# Patient Record
Sex: Female | Born: 1970 | Race: White | Hispanic: No | Smoking: Never smoker
Health system: Southern US, Community
[De-identification: ages and names within clinical notes are randomized; demographics above are authoritative.]

## PROBLEM LIST (undated history)

## (undated) DIAGNOSIS — I82409 Acute embolism and thrombosis of unspecified deep veins of unspecified lower extremity: Secondary | ICD-10-CM

## (undated) DIAGNOSIS — Z789 Other specified health status: Secondary | ICD-10-CM

## (undated) DIAGNOSIS — G43909 Migraine, unspecified, not intractable, without status migrainosus: Secondary | ICD-10-CM

## (undated) HISTORY — DX: Other specified health status: Z78.9

## (undated) HISTORY — PX: CHOLECYSTECTOMY: SHX55

## (undated) HISTORY — DX: Acute embolism and thrombosis of unspecified deep veins of unspecified lower extremity: I82.409

## (undated) HISTORY — PX: APPENDECTOMY: SHX54

## (undated) HISTORY — DX: Migraine, unspecified, not intractable, without status migrainosus: G43.909

---

## 2007-05-31 ENCOUNTER — Ambulatory Visit: Payer: Self-pay | Admitting: Family Medicine

## 2009-02-05 ENCOUNTER — Ambulatory Visit: Payer: Self-pay | Admitting: Family Medicine

## 2009-06-14 ENCOUNTER — Ambulatory Visit: Payer: Self-pay | Admitting: Otolaryngology

## 2009-08-27 ENCOUNTER — Encounter: Admission: RE | Admit: 2009-08-27 | Discharge: 2009-09-04 | Payer: Self-pay | Admitting: Specialist

## 2010-02-25 ENCOUNTER — Ambulatory Visit: Payer: Self-pay | Admitting: Family Medicine

## 2010-09-07 LAB — HM MAMMOGRAPHY: HM MAMMO: NORMAL

## 2010-09-09 ENCOUNTER — Ambulatory Visit: Payer: Self-pay | Admitting: Family

## 2011-09-08 LAB — HM PAP SMEAR: HM Pap smear: NORMAL

## 2015-04-08 ENCOUNTER — Encounter: Payer: Self-pay | Admitting: Family Medicine

## 2015-04-08 ENCOUNTER — Ambulatory Visit (INDEPENDENT_AMBULATORY_CARE_PROVIDER_SITE_OTHER): Payer: 59 | Admitting: Family Medicine

## 2015-04-08 VITALS — BP 120/83 | HR 90 | Temp 98.3°F | Resp 16 | Ht 65.0 in | Wt 254.6 lb

## 2015-04-08 DIAGNOSIS — F331 Major depressive disorder, recurrent, moderate: Secondary | ICD-10-CM | POA: Insufficient documentation

## 2015-04-08 DIAGNOSIS — F418 Other specified anxiety disorders: Secondary | ICD-10-CM

## 2015-04-08 DIAGNOSIS — F329 Major depressive disorder, single episode, unspecified: Secondary | ICD-10-CM

## 2015-04-08 DIAGNOSIS — G43909 Migraine, unspecified, not intractable, without status migrainosus: Secondary | ICD-10-CM | POA: Insufficient documentation

## 2015-04-08 DIAGNOSIS — F419 Anxiety disorder, unspecified: Principal | ICD-10-CM

## 2015-04-08 DIAGNOSIS — F330 Major depressive disorder, recurrent, mild: Secondary | ICD-10-CM | POA: Insufficient documentation

## 2015-04-08 DIAGNOSIS — G5600 Carpal tunnel syndrome, unspecified upper limb: Secondary | ICD-10-CM | POA: Insufficient documentation

## 2015-04-08 MED ORDER — ESCITALOPRAM OXALATE 10 MG PO TABS: 10.0000 mg | ORAL_TABLET | Freq: Every day | ORAL | Status: DC

## 2015-04-08 MED ORDER — VENLAFAXINE HCL ER 37.5 MG PO CP24: 75.0000 mg | ORAL_CAPSULE | Freq: Every day | ORAL | Status: DC

## 2015-04-08 NOTE — Patient Instructions (Addendum)
Stop Effexor and let's try Lexapro again for anxiety.  TO Stop effexor: Take 2 37.5mg  capsules in the AM for 3-4 days and then reduce to 1 capsule in the AM for 3-4 days and stop medication.   To Start lexapro: After effexor- Break the tablet in 1/2 for 4 days- then resume full dose.     Escitalopram tablets What is this medicine? ESCITALOPRAM (es sye TAL oh pram) is used to treat depression and certain types of anxiety. This medicine may be used for other purposes; ask your health care provider or pharmacist if you have questions. COMMON BRAND NAME(S): Lexapro What should I tell my health care provider before I take this medicine? They need to know if you have any of these conditions: -bipolar disorder or a family history of bipolar disorder -diabetes -glaucoma -heart disease -kidney or liver disease -receiving electroconvulsive therapy -seizures (convulsions) -suicidal thoughts, plans, or attempt by you or a family member -an unusual or allergic reaction to escitalopram, the related drug citalopram, other medicines, foods, dyes, or preservatives -pregnant or trying to become pregnant -breast-feeding How should I use this medicine? Take this medicine by mouth with a glass of water. Follow the directions on the prescription label. You can take it with or without food. If it upsets your stomach, take it with food. Take your medicine at regular intervals. Do not take it more often than directed. Do not stop taking this medicine suddenly except upon the advice of your doctor. Stopping this medicine too quickly may cause serious side effects or your condition may worsen. A special MedGuide will be given to you by the pharmacist with each prescription and refill. Be sure to read this information carefully each time. Talk to your pediatrician regarding the use of this medicine in children. Special care may be needed. Overdosage: If you think you have taken too much of this medicine contact a  poison control center or emergency room at once. NOTE: This medicine is only for you. Do not share this medicine with others. What if I miss a dose? If you miss a dose, take it as soon as you can. If it is almost time for your next dose, take only that dose. Do not take double or extra doses. What may interact with this medicine? Do not take this medicine with any of the following medications: -certain medicines for fungal infections like fluconazole, itraconazole, ketoconazole, posaconazole, voriconazole -cisapride -citalopram -dofetilide -dronedarone -linezolid -MAOIs like Carbex, Eldepryl, Marplan, Nardil, and Parnate -methylene blue (injected into a vein) -pimozide -thioridazine -ziprasidone This medicine may also interact with the following medications: -alcohol -aspirin and aspirin-like medicines -carbamazepine -certain medicines for depression, anxiety, or psychotic disturbances -certain medicines for migraine headache like almotriptan, eletriptan, frovatriptan, naratriptan, rizatriptan, sumatriptan, zolmitriptan -certain medicines for sleep -certain medicines that treat or prevent blood clots like warfarin, enoxaparin, dalteparin -cimetidine -diuretics -fentanyl -furazolidone -isoniazid -lithium -metoprolol -NSAIDs, medicines for pain and inflammation, like ibuprofen or naproxen -other medicines that prolong the QT interval (cause an abnormal heart rhythm) -procarbazine -rasagiline -supplements like St. John's wort, kava kava, valerian -tramadol -tryptophan This list may not describe all possible interactions. Give your health care provider a list of all the medicines, herbs, non-prescription drugs, or dietary supplements you use. Also tell them if you smoke, drink alcohol, or use illegal drugs. Some items may interact with your medicine. What should I watch for while using this medicine? Tell your doctor if your symptoms do not get better or if they get worse.  Visit  your doctor or health care professional for regular checks on your progress. Because it may take several weeks to see the full effects of this medicine, it is important to continue your treatment as prescribed by your doctor. Patients and their families should watch out for new or worsening thoughts of suicide or depression. Also watch out for sudden changes in feelings such as feeling anxious, agitated, panicky, irritable, hostile, aggressive, impulsive, severely restless, overly excited and hyperactive, or not being able to sleep. If this happens, especially at the beginning of treatment or after a change in dose, call your health care professional. Bonita Quin may get drowsy or dizzy. Do not drive, use machinery, or do anything that needs mental alertness until you know how this medicine affects you. Do not stand or sit up quickly, especially if you are an older patient. This reduces the risk of dizzy or fainting spells. Alcohol may interfere with the effect of this medicine. Avoid alcoholic drinks. Your mouth may get dry. Chewing sugarless gum or sucking hard candy, and drinking plenty of water may help. Contact your doctor if the problem does not go away or is severe. What side effects may I notice from receiving this medicine? Side effects that you should report to your doctor or health care professional as soon as possible: -allergic reactions like skin rash, itching or hives, swelling of the face, lips, or tongue -confusion -feeling faint or lightheaded, falls -fast talking and excited feelings or actions that are out of control -hallucination, loss of contact with reality -seizures -suicidal thoughts or other mood changes -unusual bleeding or bruising Side effects that usually do not require medical attention (report to your doctor or health care professional if they continue or are bothersome): -blurred vision -changes in appetite -change in sex drive or performance -headache -increased  sweating -nausea This list may not describe all possible side effects. Call your doctor for medical advice about side effects. You may report side effects to FDA at 1-800-FDA-1088. Where should I keep my medicine? Keep out of reach of children. Store at room temperature between 15 and 30 degrees C (59 and 86 degrees F). Throw away any unused medicine after the expiration date. NOTE: This sheet is a summary. It may not cover all possible information. If you have questions about this medicine, talk to your doctor, pharmacist, or health care provider.  2015, Elsevier/Gold Standard. (2013-03-21 12:32:55)

## 2015-04-08 NOTE — Assessment & Plan Note (Signed)
Pt would like to try Lexapro again for anxiety. Taper off of venlafaxine and restart lexapro.  Recheck 3 weeks to determine efficacy.

## 2015-04-08 NOTE — Progress Notes (Addendum)
Subjective:    Patient ID: Marcia Simmons, female    DOB: 03/27/1971, 44 y.o.   MRN: [id number]  HPI: Marcia Simmons is a 44 y.o. female presenting on 04/08/2015 for Follow-up   HPI  Pt presents for follow-up of depression. She is currently taking venlafaxine daily for her depression and she doesn't feel it is helping. Previous medications were Zoloft, Lexapro, and Celexa. She is unsure why she switched from lexapro in the past- cannot remember if she had any side effects.  She feels she has more anxiety than depression. Would like to talk about other medications that might help her symptoms.   Past Medical History  Diagnosis Date  . DVT (deep venous thrombosis)   . Gravida 1 para 1   . Migraines   . Carpal tunnel syndrome bilateral  . DVT (deep venous thrombosis)     No current outpatient prescriptions on file prior to visit.   No current facility-administered medications on file prior to visit.    Review of Systems  Constitutional: Negative for fever, chills and fatigue.  HENT: Negative.   Respiratory: Negative for shortness of breath and wheezing.   Cardiovascular: Negative for chest pain, palpitations and leg swelling.  Genitourinary: Negative.   Neurological: Negative for dizziness, weakness and numbness.  Psychiatric/Behavioral: Positive for sleep disturbance (trouble getting to sleeping.) and dysphoric mood. Negative for suicidal ideas. The patient is nervous/anxious.    Per HPI unless specifically indicated above     Objective:    BP 120/83 mmHg  Pulse 90  Temp(Src) 98.3 F (36.8 C) (Oral)  Resp 16  Ht  (1.651 m)  Wt 254 lb 9.6 oz (115.486 kg)  BMI 42.37 kg/m2  Wt Readings from Last 3 Encounters:  04/08/15 254 lb 9.6 oz (115.486 kg)    Depression screen PHQ 2/9 04/08/2015  Decreased Interest 2  Down, Depressed, Hopeless 2  PHQ - 2 Score 4  Altered sleeping 2  Tired, decreased energy 2  Change in appetite 2  Feeling bad or failure about  yourself  1  Trouble concentrating 0  Moving slowly or fidgety/restless 0  Suicidal thoughts 0  PHQ-9 Score 11  Difficult doing work/chores Somewhat difficult     Physical Exam  Constitutional: She appears well-developed and well-nourished. No distress.  HENT:  Head: Normocephalic and atraumatic.  Neck: Normal range of motion. Neck supple. No thyromegaly present.  Cardiovascular: Normal rate and regular rhythm.  Exam reveals no gallop and no friction rub.   No murmur heard. Pulmonary/Chest: Effort normal. She has no wheezes. She has no rales.  Skin: Skin is warm and dry. No rash noted. She is not diaphoretic. No erythema.  Psychiatric: She has a normal mood and affect. Her behavior is normal. Judgment and thought content normal.   Results for orders placed or performed in visit on 04/08/15  HM MAMMOGRAPHY  Result Value Ref Range   HM Mammogram normal   HM PAP SMEAR  Result Value Ref Range   HM Pap smear normal       Assessment & Plan:   Problem List Items Addressed This Visit      Other   Anxiety and depression - Primary    Pt would like to try Lexapro again for anxiety. Taper off of venlafaxine and restart lexapro.  Recheck 3 weeks to determine efficacy.         Relevant Medications   venlafaxine XR (EFFEXOR XR) 37.5 MG 24 hr capsule   escitalopram (  LEXAPRO) 10 MG tablet      Meds ordered this encounter  Medications  . phentermine 37.5 MG capsule    Sig: Take 37.5 mg by mouth every morning.  . venlafaxine XR (EFFEXOR XR) 37.5 MG 24 hr capsule    Sig: Take 2 capsules (75 mg total) by mouth daily. Take 2 capsules  AM for 3-4 days. Take 1 capsule in the AM for 3 days and stop medication.    Dispense:  10 capsule    Refill:  0    Order Specific Question:  Supervising Provider    Answer:  Janeann Forehand [phone]  . escitalopram (LEXAPRO) 10 MG tablet    Sig: Take 1 tablet (10 mg total) by mouth daily.    Dispense:  90 tablet    Refill:  3    Order Specific  Question:  Supervising Provider    Answer:  Janeann Forehand [[id number]]      Follow up plan: Return in about 3 weeks (around 04/29/2015), or if symptoms worsen or fail to improve.

## 2016-04-17 ENCOUNTER — Other Ambulatory Visit: Payer: Self-pay | Admitting: Family Medicine

## 2016-04-17 DIAGNOSIS — F419 Anxiety disorder, unspecified: Principal | ICD-10-CM

## 2016-04-17 DIAGNOSIS — F329 Major depressive disorder, single episode, unspecified: Secondary | ICD-10-CM

## 2016-05-01 ENCOUNTER — Encounter: Payer: Self-pay | Admitting: Family Medicine

## 2016-05-01 ENCOUNTER — Ambulatory Visit (INDEPENDENT_AMBULATORY_CARE_PROVIDER_SITE_OTHER): Payer: 59 | Admitting: Family Medicine

## 2016-05-01 VITALS — BP 135/85 | HR 76 | Temp 97.6°F | Resp 16 | Ht 65.0 in | Wt 245.6 lb

## 2016-05-01 DIAGNOSIS — F419 Anxiety disorder, unspecified: Principal | ICD-10-CM

## 2016-05-01 DIAGNOSIS — F418 Other specified anxiety disorders: Secondary | ICD-10-CM

## 2016-05-01 DIAGNOSIS — F329 Major depressive disorder, single episode, unspecified: Secondary | ICD-10-CM

## 2016-05-01 DIAGNOSIS — E559 Vitamin D deficiency, unspecified: Secondary | ICD-10-CM | POA: Diagnosis not present

## 2016-05-01 MED ORDER — BUPROPION HCL ER (XL) 150 MG PO TB24: 150.0000 mg | ORAL_TABLET | Freq: Every day | ORAL | 1 refills | Status: DC

## 2016-05-01 NOTE — Patient Instructions (Addendum)
Let's try the Wellbutrin to see if that helps with your symptoms. Take 150mg  once daily. You can take 2 daily after 1 week if you feel the medication is helping. Just let me know and I will send in a prescription for the 300mg .  To taper off the Lexapro just take 1/2 tablet for 2-4 days.     Bupropion extended-release tablets (Depression/Mood Disorders) What is this medicine? BUPROPION (byoo PROE pee on) is used to treat depression. This medicine may be used for other purposes; ask your health care provider or pharmacist if you have questions. What should I tell my health care provider before I take this medicine? They need to know if you have any of these conditions: -an eating disorder, such as anorexia or bulimia -bipolar disorder or psychosis -diabetes or high blood sugar, treated with medication -glaucoma -head injury or brain tumor -heart disease, previous heart attack, or irregular heart beat -high blood pressure -kidney or liver disease -seizures (convulsions) -suicidal thoughts or a previous suicide attempt -Tourette's syndrome -weight loss -an unusual or allergic reaction to bupropion, other medicines, foods, dyes, or preservatives -breast-feeding -pregnant or trying to become pregnant How should I use this medicine? Take this medicine by mouth with a glass of water. Follow the directions on the prescription label. You can take it with or without food. If it upsets your stomach, take it with food. Do not crush, chew, or cut these tablets. This medicine is taken once daily at the same time each day. Do not take your medicine more often than directed. Do not stop taking this medicine suddenly except upon the advice of your doctor. Stopping this medicine too quickly may cause serious side effects or your condition may worsen. A special MedGuide will be given to you by the pharmacist with each prescription and refill. Be sure to read this information carefully each time. Talk to  your pediatrician regarding the use of this medicine in children. Special care may be needed. Overdosage: If you think you have taken too much of this medicine contact a poison control center or emergency room at once. NOTE: This medicine is only for you. Do not share this medicine with others. What if I miss a dose? If you miss a dose, skip the missed dose and take your next tablet at the regular time. Do not take double or extra doses. What may interact with this medicine? Do not take this medicine with any of the following medications: -linezolid -MAOIs like Azilect, Carbex, Eldepryl, Marplan, Nardil, and Parnate -methylene blue (injected into a vein) -other medicines that contain bupropion like Zyban This medicine may also interact with the following medications: -alcohol -certain medicines for anxiety or sleep -certain medicines for blood pressure like metoprolol, propranolol -certain medicines for depression or psychotic disturbances -certain medicines for HIV or AIDS like efavirenz, lopinavir, nelfinavir, ritonavir -certain medicines for irregular heart beat like propafenone, flecainide -certain medicines for Parkinson's disease like amantadine, levodopa -certain medicines for seizures like carbamazepine, phenytoin, phenobarbital -cimetidine -clopidogrel -cyclophosphamide -furazolidone -isoniazid -nicotine -orphenadrine -procarbazine -steroid medicines like prednisone or cortisone -stimulant medicines for attention disorders, weight loss, or to stay awake -tamoxifen -theophylline -thiotepa -ticlopidine -tramadol -warfarin This list may not describe all possible interactions. Give your health care provider a list of all the medicines, herbs, non-prescription drugs, or dietary supplements you use. Also tell them if you smoke, drink alcohol, or use illegal drugs. Some items may interact with your medicine. What should I watch for while using this medicine?  Tell your doctor  if your symptoms do not get better or if they get worse. Visit your doctor or health care professional for regular checks on your progress. Because it may take several weeks to see the full effects of this medicine, it is important to continue your treatment as prescribed by your doctor. Patients and their families should watch out for new or worsening thoughts of suicide or depression. Also watch out for sudden changes in feelings such as feeling anxious, agitated, panicky, irritable, hostile, aggressive, impulsive, severely restless, overly excited and hyperactive, or not being able to sleep. If this happens, especially at the beginning of treatment or after a change in dose, call your health care professional. Avoid alcoholic drinks while taking this medicine. Drinking large amounts of alcoholic beverages, using sleeping or anxiety medicines, or quickly stopping the use of these agents while taking this medicine may increase your risk for a seizure. Do not drive or use heavy machinery until you know how this medicine affects you. This medicine can impair your ability to perform these tasks. Do not take this medicine close to bedtime. It may prevent you from sleeping. Your mouth may get dry. Chewing sugarless gum or sucking hard candy, and drinking plenty of water may help. Contact your doctor if the problem does not go away or is severe. The tablet shell for some brands of this medicine does not dissolve. This is normal. The tablet shell may appear whole in the stool. This is not a cause for concern. What side effects may I notice from receiving this medicine? Side effects that you should report to your doctor or health care professional as soon as possible: -allergic reactions like skin rash, itching or hives, swelling of the face, lips, or tongue -breathing problems -changes in vision -confusion -fast or irregular heartbeat -hallucinations -increased blood pressure -redness, blistering, peeling  or loosening of the skin, including inside the mouth -seizures -suicidal thoughts or other mood changes -unusually weak or tired -vomiting Side effects that usually do not require medical attention (report to your doctor or health care professional if they continue or are bothersome): -change in sex drive or performance -constipation -headache -loss of appetite -nausea -tremors -weight loss This list may not describe all possible side effects. Call your doctor for medical advice about side effects. You may report side effects to FDA at 1-800-FDA-1088. Where should I keep my medicine? Keep out of the reach of children. Store at room temperature between 15 and 30 degrees C (59 and 86 degrees F). Throw away any unused medicine after the expiration date. NOTE: This sheet is a summary. It may not cover all possible information. If you have questions about this medicine, talk to your doctor, pharmacist, or health care provider.    2016, Elsevier/Gold Standard. (2013-03-17 12:39:42)

## 2016-05-01 NOTE — Assessment & Plan Note (Signed)
Recheck Vitamin D levels to determine if repletion is needed.

## 2016-05-01 NOTE — Progress Notes (Signed)
Subjective:    Patient ID: Marcia Simmons, female    DOB: [birth year], 45 y.o.   MRN: [id number]  HPI: Marcia Simmons is a 45 y.o. female presenting on 05/01/2016 for Depression   HPI  Pt presents for follow-up of anxiety and depression. Was changed to lexapro from Effexor at her last visit. Does not feel the medication is helping. She has previously failed Lexapro, Citalopram, sertraline, and venlafaxine.  Feels both anxious and depression. No SI/ HI. Not sleeping very well. Early morning awakenings. Goes to bed at 10pm and wakes up at 4 am.   Past Medical History:  Diagnosis Date  . Carpal tunnel syndrome bilateral  . DVT (deep venous thrombosis) (HCC)   . DVT (deep venous thrombosis) (HCC)   . Gravida 1 para 1   . Migraines     Current Outpatient Prescriptions on File Prior to Visit  Medication Sig  . phentermine 37.5 MG capsule Take 37.5 mg by mouth every morning.   No current facility-administered medications on file prior to visit.     Review of Systems  Constitutional: Negative for chills and fever.  HENT: Negative.   Respiratory: Negative for cough, chest tightness and wheezing.   Cardiovascular: Negative for chest pain and leg swelling.  Gastrointestinal: Negative for abdominal pain, constipation, diarrhea, nausea and vomiting.  Endocrine: Negative.  Negative for cold intolerance, heat intolerance, polydipsia, polyphagia and polyuria.  Genitourinary: Negative for difficulty urinating and dysuria.  Musculoskeletal: Negative.   Neurological: Negative for dizziness, light-headedness and numbness.  Psychiatric/Behavioral: Positive for decreased concentration, dysphoric mood and sleep disturbance. Negative for suicidal ideas.   Per HPI unless specifically indicated above     Objective:    BP 135/85 (BP Location: Left Arm, Patient Position: Sitting, Cuff Size: Large)   Pulse 76   Temp 97.6 F (36.4 C) (Oral)   Resp 16   Ht 5\' 5"  (1.651 m)   Wt 245 lb 9.6  oz (111.4 kg)   LMP 04/21/2016   BMI 40.87 kg/m   Wt Readings from Last 3 Encounters:  05/01/16 245 lb 9.6 oz (111.4 kg)  04/08/15 254 lb 9.6 oz (115.5 kg)    Physical Exam  Constitutional: She is oriented to person, place, and time. She appears well-developed and well-nourished.  HENT:  Head: Normocephalic and atraumatic.  Neck: Neck supple.  Cardiovascular: Normal rate, regular rhythm and normal heart sounds.  Exam reveals no gallop and no friction rub.   No murmur heard. Pulmonary/Chest: Effort normal and breath sounds normal. She has no wheezes. She exhibits no tenderness.  Abdominal: Soft. Normal appearance and bowel sounds are normal. She exhibits no distension and no mass. There is no tenderness. There is no rebound and no guarding.  Musculoskeletal: Normal range of motion. She exhibits no edema or tenderness.  Lymphadenopathy:    She has no cervical adenopathy.  Neurological: She is alert and oriented to person, place, and time.  Skin: Skin is warm and dry.  Psychiatric: She has a normal mood and affect. Her speech is normal and behavior is normal. Judgment and thought content normal. Cognition and memory are normal.   Results for orders placed or performed in visit on 04/08/15  HM MAMMOGRAPHY  Result Value Ref Range   HM Mammogram normal   HM PAP SMEAR  Result Value Ref Range   HM Pap smear normal       Assessment & Plan:   Problem List Items Addressed This Visit  Other   Anxiety and depression - Primary    Change to Wellbutrin 2/2 failure of lexapro. Pt aware it it may not be as effective for anxiety. Reviewed side effects and benefits. Reviewed how to taper off of lexapro.  Consider Viibryd if symptoms do not improve. Recheck 4-6 weeks.       Relevant Medications   buPROPion (WELLBUTRIN XL) 150 MG 24 hr tablet   Other Relevant Orders   TSH   B12   Folate   Comprehensive metabolic panel   Vitamin D deficiency    Recheck Vitamin D levels to determine  if repletion is needed.       Relevant Orders   Vitamin D (25 hydroxy)    Other Visit Diagnoses   None.     Meds ordered this encounter  Medications  . omeprazole (PRILOSEC) 20 MG capsule    Sig: Take 20 mg by mouth daily.  Marland Kitchen. buPROPion (WELLBUTRIN XL) 150 MG 24 hr tablet    Sig: Take 1 tablet (150 mg total) by mouth daily.    Dispense:  30 tablet    Refill:  1    Order Specific Question:   Supervising Provider    Answer:   Janeann ForehandHAWKINS JR, JAMES H [[id number]]      Follow up plan: Return in about 4 weeks (around 05/29/2016) for Depression..Marland Kitchen

## 2016-05-01 NOTE — Assessment & Plan Note (Signed)
Change to Wellbutrin 2/2 failure of lexapro. Pt aware it it may not be as effective for anxiety. Reviewed side effects and benefits. Reviewed how to taper off of lexapro.  Consider Viibryd if symptoms do not improve. Recheck 4-6 weeks.

## 2016-05-29 ENCOUNTER — Encounter (INDEPENDENT_AMBULATORY_CARE_PROVIDER_SITE_OTHER): Payer: Self-pay

## 2016-05-29 ENCOUNTER — Encounter: Payer: Self-pay | Admitting: Family Medicine

## 2016-05-29 ENCOUNTER — Ambulatory Visit (INDEPENDENT_AMBULATORY_CARE_PROVIDER_SITE_OTHER): Payer: 59 | Admitting: Family Medicine

## 2016-05-29 VITALS — BP 127/87 | HR 84 | Temp 98.0°F | Resp 16 | Ht 65.0 in | Wt 242.0 lb

## 2016-05-29 DIAGNOSIS — Z6841 Body Mass Index (BMI) 40.0 and over, adult: Secondary | ICD-10-CM | POA: Diagnosis not present

## 2016-05-29 DIAGNOSIS — F418 Other specified anxiety disorders: Secondary | ICD-10-CM

## 2016-05-29 DIAGNOSIS — F419 Anxiety disorder, unspecified: Principal | ICD-10-CM

## 2016-05-29 DIAGNOSIS — E669 Obesity, unspecified: Secondary | ICD-10-CM | POA: Insufficient documentation

## 2016-05-29 DIAGNOSIS — F329 Major depressive disorder, single episode, unspecified: Secondary | ICD-10-CM

## 2016-05-29 MED ORDER — BUPROPION HCL ER (XL) 300 MG PO TB24: 300.0000 mg | ORAL_TABLET | Freq: Every day | ORAL | 3 refills | Status: DC

## 2016-05-29 NOTE — Assessment & Plan Note (Signed)
Much improved on wellbutrin. Will go up to 300mg  today for better effect. Recheck 3 mos.

## 2016-05-29 NOTE — Assessment & Plan Note (Signed)
Pt currently taking phentermine from bariatric clinic. [facility] appeal complete. 10lb weight loss last month. Discussed how starting phentermine would be unsafe as provider is leaving the practice. Discussed other weight loss medications- pt declined Qsymia at this time. Will stick with current provider.

## 2016-05-29 NOTE — Patient Instructions (Signed)
We will go up on the Wellbutrin to 300mg  once daily to determine if it helps your symptoms.  Weight loss- keep up the good work. [facility] You can call and see if her schedule has availability.

## 2016-05-29 NOTE — Progress Notes (Signed)
Subjective:    Patient ID: Marcia Batonebecca L Simmons, female    DOB: Jan 12, 1971, 45 y.o.   MRN: [id number]  HPI: Marcia Simmons is a 45 y.o. female presenting on 05/29/2016 for Depression (little improvement)   HPI  Pt presents for follow-up of depression. Feels she is doing well on Wellbutrin. Sleeping better. Feeling overall better Is noticing some constipation. Not going as frequently. Feels she needs to strain for BM.   Would like to discuss phentermine for weight loss. Is getting from [facility] Clinic but co-pay is high.  Has not had her lab work done yet.   Past Medical History:  Diagnosis Date  . Carpal tunnel syndrome bilateral  . DVT (deep venous thrombosis) (HCC)   . DVT (deep venous thrombosis) (HCC)   . Gravida 1 para 1   . Migraines     Current Outpatient Prescriptions on File Prior to Visit  Medication Sig  . omeprazole (PRILOSEC) 20 MG capsule Take 20 mg by mouth daily.  . phentermine 37.5 MG capsule Take 37.5 mg by mouth every morning.   No current facility-administered medications on file prior to visit.     Review of Systems  Constitutional: Negative for chills and fever.  HENT: Negative.   Respiratory: Negative for cough, chest tightness and wheezing.   Cardiovascular: Negative for chest pain and leg swelling.  Gastrointestinal: Negative for abdominal pain, constipation, diarrhea, nausea and vomiting.  Endocrine: Negative.  Negative for cold intolerance, heat intolerance, polydipsia, polyphagia and polyuria.  Genitourinary: Negative for difficulty urinating and dysuria.  Musculoskeletal: Negative.   Neurological: Negative for dizziness, light-headedness and numbness.  Psychiatric/Behavioral: Positive for dysphoric mood. Negative for agitation, behavioral problems, sleep disturbance and suicidal ideas. The patient is not nervous/anxious.    Per HPI unless specifically indicated above [facility] 05/29/2016 05/01/2016 04/08/2015 04/08/2015    Decreased Interest 1 2 2 2   Down, Depressed, Hopeless 1 2 2 2   PHQ - 2 Score 2 4 4 4   Altered sleeping 1 2 2 2   Tired, decreased energy 1 2 2 2   Change in appetite 0 0 2 2  Feeling bad or failure about yourself  1 1 1 1   Trouble concentrating 1 1 0 0  Moving slowly or fidgety/restless 0 2 0 0  Suicidal thoughts 0 0 0 0  PHQ-9 Score 6 12 11 11   Difficult doing work/chores Somewhat difficult - - Somewhat difficult       Objective:    BP 127/87   Pulse 84   Temp 98 F (36.7 C) (Oral)   Resp 16   Ht 5\' 5"  (1.651 m)   Wt 242 lb (109.8 kg)   LMP 05/22/2016   BMI 40.27 kg/m BMI  Wt Readings from Last 3 Encounters:  05/29/16 242 lb (109.8 kg)  05/01/16 245 lb 9.6 oz (111.4 kg)  04/08/15 254 lb 9.6 oz (115.5 kg)    Physical Exam  Constitutional: She is oriented to person, place, and time. She appears well-developed and well-nourished.  HENT:  Head: Normocephalic and atraumatic.  Neck: Neck supple.  Cardiovascular: Normal rate, regular rhythm and normal heart sounds.  Exam reveals no gallop and no friction rub.   No murmur heard. Pulmonary/Chest: Effort normal and breath sounds normal. She has no wheezes. She exhibits no tenderness.  Abdominal: Soft. Normal appearance and bowel sounds are normal. She exhibits no distension and no mass. There is no tenderness. There is no rebound and no guarding.  Musculoskeletal: Normal range  of motion. She exhibits no edema or tenderness.  Lymphadenopathy:    She has no cervical adenopathy.  Neurological: She is alert and oriented to person, place, and time.  Skin: Skin is warm and dry.  Psychiatric: She has a normal mood and affect. Her behavior is normal. Judgment and thought content normal.   Results for orders placed or performed in visit on 04/08/15  HM MAMMOGRAPHY  Result Value Ref Range   HM Mammogram normal   HM PAP SMEAR  Result Value Ref Range   HM Pap smear normal       Assessment & Plan:   Problem List Items Addressed  This Visit      Other   Anxiety and depression - Primary    Much improved on wellbutrin. Will go up to 300mg  today for better effect. Recheck 3 mos.       Relevant Medications   buPROPion (WELLBUTRIN XL) 300 MG 24 hr tablet   BMI 40.0-44.9, adult (HCC)    Pt currently taking phentermine from bariatric clinic. [facility] appeal complete. 10lb weight loss last month. Discussed how starting phentermine would be unsafe as provider is leaving the practice. Discussed other weight loss medications- pt declined Qsymia at this time. Will stick with current provider.        Other Visit Diagnoses   None.     Meds ordered this encounter  Medications  . buPROPion (WELLBUTRIN XL) 300 MG 24 hr tablet    Sig: Take 1 tablet (300 mg total) by mouth daily.    Dispense:  90 tablet    Refill:  3    Order Specific Question:   Supervising Provider    Answer:   Janeann Forehand [[id number]]      Follow up plan: Return in about 3 months (around 08/28/2016), or if symptoms worsen or fail to improve, for Depression. Marland Kitchen

## 2016-06-11 ENCOUNTER — Ambulatory Visit: Payer: 59 | Admitting: Family Medicine

## 2016-07-17 ENCOUNTER — Encounter: Payer: Self-pay | Admitting: Family Medicine

## 2016-07-17 ENCOUNTER — Ambulatory Visit (INDEPENDENT_AMBULATORY_CARE_PROVIDER_SITE_OTHER): Payer: 59 | Admitting: Family Medicine

## 2016-07-17 VITALS — BP 123/85 | HR 98 | Temp 97.8°F | Resp 16 | Ht 65.0 in | Wt 239.0 lb

## 2016-07-17 DIAGNOSIS — R0789 Other chest pain: Secondary | ICD-10-CM | POA: Insufficient documentation

## 2016-07-17 DIAGNOSIS — F329 Major depressive disorder, single episode, unspecified: Secondary | ICD-10-CM

## 2016-07-17 DIAGNOSIS — R221 Localized swelling, mass and lump, neck: Secondary | ICD-10-CM | POA: Diagnosis not present

## 2016-07-17 DIAGNOSIS — F418 Other specified anxiety disorders: Secondary | ICD-10-CM

## 2016-07-17 DIAGNOSIS — F419 Anxiety disorder, unspecified: Secondary | ICD-10-CM

## 2016-07-17 MED ORDER — BACLOFEN 10 MG PO TABS: 5.0000 mg | ORAL_TABLET | Freq: Three times a day (TID) | ORAL | 1 refills | Status: DC | PRN

## 2016-07-17 NOTE — Patient Instructions (Signed)
Thank you for coming in to clinic today.  1. I am concerned about your chest pain. Most likely it is related to Phentermine. As discussed the safest option is to have it checked out by the Emergency Room with more detailed testing including D-dimer to rule out blood clot in lung. If it gets worse, I strongly recommend this plan. - It seems less likely muscle related but possibly could be deeper muscles in chest wall  EKG is normal. But I do not have one to compare it to. I do not see evidence of blood clot and no evidence of heart attack, but it cannot rule out a clot or all underlying problems.  Start taking Baclofen (Lioresal) 10mg  (muscle relaxant) - start with half (cut) to one whole pill at night as needed for next 1-3 nights (may make you drowsy, caution with driving) see how it affects you, then if tolerated increase to one pill 2 to 3 times a day or (every 8 hours as needed)  Recommend to start taking Tylenol Extra Strength 500mg  tabs - take 1 to 2 tabs (max 1000mg  per dose) every 6 hours for pain (take regularly, don't skip a dose for next 3-7 days), max 24 hour daily dose is 6 to 8 tablets or 3000 to 4000mg   Stop taking Ibuprofen.  Try heating pad or ice packs to see if relief  Avoid over exertion this weekend.  If you have any significant chest pain that does not go away within 30 minutes, is accompanied by nausea, sweating, shortness of breath, or made worse by activity, this may be evidence of a heart attack, especially if symptoms worsening instead of improving, please call 911 or go directly to the emergency room immediately for evaluation.  Follow-up Neck Mass, could be lymph node vs cyst. Return in 1 month to re-evaluate this.  Please schedule a follow-up appointment with Dr. Althea CharonKaramalegos in 3-4 days to follow-up chest pain, discuss additional lab testing.  If you have any other questions or concerns, please feel free to call the clinic or send a message through MyChart. You  may also schedule an earlier appointment if necessary.  Saralyn PilarAlexander Tyjai Matuszak, DO [facility]

## 2016-07-17 NOTE — Assessment & Plan Note (Signed)
Well controlled on Wellbutrin 300mg  daily. Concern possible anxiety related to some atypical features of chest pain, but does not seem related given improved symptom control.

## 2016-07-17 NOTE — Assessment & Plan Note (Addendum)
Active mild persistent chest pain (chronic now >2 months) with mixed features, some atypical but location and description concerning, also with increasing frequency of episodes. However, reassuring that it is non exertional and without associated symptoms. - Differential: primary concern for cardiac etiology with angina (other than obesity, no significant risk factors, but family history of early MI in father), also concern for possible PE (personal h/o DVT suspected to be provoked, no recurrence, fam history of DVT, Well's PE score 3 = unlikely, even if you count tachycardia >100 max HR was 98 but improved to 91 on EKG), less likely to be GERD related, possible MSK costochondritis related with working out weight lifting and weight loss but not reproducible or positional related. Ultimately suspect it could be related to recent phentermine use for wt loss, she stopped it 2 weeks ago, still symptoms though.  Plan: 1. Checked EKG in office - NSR, HR 91, no acute ischemic ST-T wave changes. No old EKG for comparison. No evidence of strain seen in PE with S1Q3T3 pattern. 2. Stop ibuprofen. Take ASA 81mg  daily for now 3. Trial on Baclofen titration, regular Tylenol dosing 4. Use topical heat/ice, avoid exertion this weekend 5. Long discussion on consider lab with d-dimer, she has had prior test before when concern for recurrent clot but d-dimer was negative years ago. We do not have lab available in afternoon, and I advised her that given Friday afternoon difficult follow-up, she would need to go directly to the Emergency Department for labs, d-dimer, and evaluation for chest pain. Given negative EKG and discussion, she declines going to ED but will go there with strict return precautions if any significant worsening over weekend. 6. Follow-up with me 3-4 days, see if treatment helps, otherwise if not dramatically improved, will proceed with d-dimer testing, if negative and persistent symptoms then will consider  ED vs more urgent work in with Cardiology

## 2016-07-17 NOTE — Assessment & Plan Note (Signed)
Suspected LAD vs cyst, no evidence of abscess or infection. Non tender. No other associated LAD. No obvious trigger for neck LAD. - Primary concern today was chest pain. Advised patient we could follow-up this within next 4 weeks to monitor progress, if still present or enlarged we would proceed with neck ultrasound to evaluate further

## 2016-07-17 NOTE — Progress Notes (Signed)
Subjective:    Patient ID: Marcia Simmons, female    DOB: 10/05/1970, 45 y.o.   MRN: [id number]  Marcia Simmons is a 45 y.o. female presenting on 07/17/2016 for Anxiety (month ) and Cyst (neck more R side)   HPI   Chest Pain, non exertional, chronic: - Reports gradual worsening over past 2 months, without known injury or inciting event. Initially attributed it to stress but she does think increased dose of Wellbutrin is controlling her stress and depression much better now, has pain in times without stress. Describes as dull aching tightness in chest, mild severity up to 2/10, slightly right of mid sternum, without sharp pains. Does also have associated pain in right posterior shoulder. Increasing frequency of intermittent episodes lasting minutes to hours, usually most days of the week, previously was only 1-2 x weekly at onset. Not related to exertion at all, she is able to work out at gym elliptical machine or minor weight lifting exercises without trigger or worsening pain. - Not related to eating. Has GERD but controlled on OTC Omeprazole 20 - Tried ibuprofen 800mg  as needed a few doses, not significant relief - Previously took phentermine off and on for 1 year for weight loss, was taking it recently, but she stopped taking it 2 weeks ago, has not improved symptoms - Additional history with Father having CAD with MI age 21+. She has personal history of DVT in 2006, initially on heparin and then coumadin for up to 3 years, thought to be provoked with OCPs and prolonged sitting with long drives. No recurrence. Has been off OCPs. Never smoker. Both parents history of blood clots. - Admits mild active chest pain currently, without worsening - Denies associated diaphoresis, nausea, vomiting, dyspnea, cough, lightheaded, dizzy, numbness, tingling, radiation of pain to arm or jaw  RIGHT POSTERIOR NECK MASS - Reports felt a "knot" on upper back of neck for past 2 months, incidentally found  it, wanted it checked out - No prior similar history of enlarged lymph nodes or masses or cysts - Denies any pain, redness, swelling, drainage, fevers/chills   Social History  Substance Use Topics  . Smoking status: Never Smoker  . Smokeless tobacco: Never Used  . Alcohol use No    Review of Systems Per HPI unless specifically indicated above     Objective:    BP 123/85   Pulse 98   Temp 97.8 F (36.6 C) (Oral)   Resp 16   Ht 5\' 5"  (1.651 m)   Wt 239 lb (108.4 kg)   BMI 39.77 kg/m   Wt Readings from Last 3 Encounters:  07/17/16 239 lb (108.4 kg)  05/29/16 242 lb (109.8 kg)  05/01/16 245 lb 9.6 oz (111.4 kg)    Physical Exam  Constitutional: She is oriented to person, place, and time. She appears well-developed and well-nourished. No distress.  Well-appearing, comfortable, cooperative  HENT:  Head: Normocephalic and atraumatic.  Neck: Normal range of motion. Neck supple. No thyromegaly present.  Right posterior upper cervical possible lymphadenopathy 1.5 cm round deeper mass, mobile, but not fluctuant and no localized induration, erythema, tenderness.  No other lymphadenopathy of head, neck.  Cardiovascular: Normal rate, regular rhythm, normal heart sounds and intact distal pulses.   No murmur heard. Pulmonary/Chest: Effort normal and breath sounds normal. No respiratory distress. She has no wheezes. She has no rales. She exhibits no tenderness (No reproducible anterior chest wall tenderness).  Abdominal: She exhibits no distension.  Musculoskeletal: Normal range of  motion. She exhibits tenderness (mild discomfort tender to palpation Right posterior scapular muscles.). She exhibits no edema (No lower extremity worsening edema. Mild bilateral non pitting edema, stable without change.).  Neurological: She is alert and oriented to person, place, and time.  Skin: Skin is warm and dry. No rash noted. She is not diaphoretic.  Psychiatric: She has a normal mood and affect. Her  behavior is normal.  Well groomed, good eye contact, does not appear anxious  Nursing note and vitals reviewed.       Assessment & Plan:   Problem List Items Addressed This Visit    Other chest pain - Primary    Active mild persistent chest pain (chronic now >2 months) with mixed features, some atypical but location and description concerning, also with increasing frequency of episodes. However, reassuring that it is non exertional and without associated symptoms. - Differential: primary concern for cardiac etiology with angina (other than obesity, no significant risk factors, but family history of early MI in father), also concern for possible PE (personal h/o DVT suspected to be provoked, no recurrence, fam history of DVT, Well's PE score 3 = unlikely, even if you count tachycardia >100 max HR was 98 but improved to 91 on EKG), less likely to be GERD related, possible MSK costochondritis related with working out weight lifting and weight loss but not reproducible or positional related. Ultimately suspect it could be related to recent phentermine use for wt loss, she stopped it 2 weeks ago, still symptoms though.  Plan: 1. Checked EKG in office - NSR, HR 91, no acute ischemic ST-T wave changes. No old EKG for comparison. No evidence of strain seen in PE with S1Q3T3 pattern. 2. Stop ibuprofen. Take ASA 81mg  daily for now 3. Trial on Baclofen titration, regular Tylenol dosing 4. Use topical heat/ice, avoid exertion this weekend 5. Long discussion on consider lab with d-dimer, she has had prior test before when concern for recurrent clot but d-dimer was negative years ago. We do not have lab available in afternoon, and I advised her that given Friday afternoon difficult follow-up, she would need to go directly to the Emergency Department for labs, d-dimer, and evaluation for chest pain. Given negative EKG and discussion, she declines going to ED but will go there with strict return precautions if any  significant worsening over weekend. 6. Follow-up with me 3-4 days, see if treatment helps, otherwise if not dramatically improved, will proceed with d-dimer testing, if negative and persistent symptoms then will consider ED vs more urgent work in with Cardiology      Relevant Medications   baclofen (LIORESAL) 10 MG tablet   Mass of right side of neck    Suspected LAD vs cyst, no evidence of abscess or infection. Non tender. No other associated LAD. No obvious trigger for neck LAD. - Primary concern today was chest pain. Advised patient we could follow-up this within next 4 weeks to monitor progress, if still present or enlarged we would proceed with neck ultrasound to evaluate further      Anxiety and depression    Well controlled on Wellbutrin 300mg  daily. Concern possible anxiety related to some atypical features of chest pain, but does not seem related given improved symptom control.         Meds ordered this encounter  Medications  . baclofen (LIORESAL) 10 MG tablet    Sig: Take 0.5-1 tablets (5-10 mg total) by mouth 3 (three) times daily as needed for muscle spasms.  Dispense:  30 each    Refill:  1      Follow up plan: Return in about 3 days (around 07/20/2016) for chest pain.  Saralyn PilarAlexander Isacc Turney, DO [facility] 07/17/2016, 5:25 PM

## 2016-07-21 ENCOUNTER — Ambulatory Visit (INDEPENDENT_AMBULATORY_CARE_PROVIDER_SITE_OTHER): Payer: 59 | Admitting: Family Medicine

## 2016-07-21 ENCOUNTER — Telehealth: Payer: Self-pay | Admitting: Family Medicine

## 2016-07-21 ENCOUNTER — Encounter: Payer: Self-pay | Admitting: Family Medicine

## 2016-07-21 VITALS — BP 118/86 | HR 79 | Temp 97.8°F | Resp 16 | Ht 65.0 in | Wt 238.0 lb

## 2016-07-21 DIAGNOSIS — R0789 Other chest pain: Secondary | ICD-10-CM

## 2016-07-21 DIAGNOSIS — Z86718 Personal history of other venous thrombosis and embolism: Secondary | ICD-10-CM | POA: Diagnosis not present

## 2016-07-21 LAB — D-DIMER, QUANTITATIVE: D-DIMER: 3.28 mg/L FEU — ABNORMAL HIGH (ref 0.00–0.49)

## 2016-07-21 NOTE — Telephone Encounter (Addendum)
Elevated STAT D-dimer result today 3.28 similar to previous D-dimer checked in 2016 when DVT was ruled out. See office note from today for full discussion. Unable to reach patient earlier today approx 1630 when received stat lab results, left voicemail. Re-attempted and contacted patient approx 1725 today, discussed positive result and [facility] further evaluation of possible PE with Chest CTA. She will go directly to ED.  [facility] to notify charge nurse of potential patient arrival, for further work-up with positive outpatient D-dimer, given persistent chest pain with worsening and high risk prior history, will need chemistry for Cr, and consider repeat EKG, CXR, labs.  Follow-up as planned if PE ruled out on CTA and other work-up for chest pain unremarkable, will anticipate patient may need to establish with Cardiology for stress test or other work-up as next step in future if persistent chest pain.  Saralyn PilarAlexander Mariene Dickerman, DO [facility] 07/21/2016, 5:26 PM

## 2016-07-21 NOTE — Assessment & Plan Note (Addendum)
Persistent unchanged mild non exertional chest pain (present >2 months), active symptoms currently.Mixed features suspected atypical etiology, did not resolve or improve on muscle relaxant / tylenol / ASA. See last A&P for additional differential information. Today HR improved in 70s, BP normal, hemodynamically stable, well appearing. Well's PE (score 1.5, unlikely), EKG last visit reassuring without evidence of ischemia. No clinical evidence of DVT, same from last visit.  Plan: 1. Discussion with patient over past 2 visits regarding potential risk for PE given her personal DVT and fam history, her risk is low but given chronicity of problem >2 months and worsening, discussed recommendation to proceed with D-dimer. Ordered STAT D-Dimer for [facility] draw, to go today, and will follow-up with patient on result. If negative, reassurance, follow-up chest pain within 1 month, continue conservative therapy, strict return criteria given, if positive, should go directly to ED for further labs and pursue Chest CTA to eval for possible PE, also advised may get additional CXR, EKG, chemistry with Cr, Troponin. - Future if no cardiac/PE etiology, consider maximizing anxiety control, may increase GERD treatment, consider referral to Cardiology for additional work up

## 2016-07-21 NOTE — Assessment & Plan Note (Signed)
No evidence of DVT, prior suspected provoked LLE DVT 2006, treated anticoag up to 3 years. Concern with fam history DVT/PE in both parents. Currently concerned for possible PE since last presentation 3 days ago, with persistent symptoms >2 months now. - STAT D-dimer, follow-up results, see A&P chest pain

## 2016-07-21 NOTE — Progress Notes (Signed)
Subjective:    Patient ID: Marcia Simmons, female    DOB: 1970-10-29, 45 y.o.   MRN: [id number]  Marcia Simmons is a 45 y.o. female presenting on 07/21/2016 for Chest Pain (follow up )   HPI   FOLLOW-UP Chest Pain, non exertional, chronic: - [facility] for same complaint, initial visit for new chronic non exertional chest pain. 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 Also concern with family history of DVT/PE in both parents. Pain was not consistent with cardiac etiology, given non-exertional, but concern with father having CAD MI at age 43+. Also she was previously on Phentermine for up to 1 year, she had stopped this >2 weeks ago, we discussed last visit this could be contributing. - She was given rx for Baclofen muscle relaxant trial, improves her Right shoulder but not improving the persistent chest pain. Still describes present mild dull aching mid sternal and right sided chest pain same severity 2/10, without any worsening or significant improvement, usually lasts hours at time. 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 She was treated with coumadin for 3 years then off. Concerning family history with DVT/PE as well. [facility] for [facility] Left leg pain, had D-dimer mild elevated at 328 (nml 220), had bilateral leg venous duplex that was negative for DVT.   Social History  Substance Use Topics  . Smoking status: Never Smoker  . Smokeless tobacco: Never Used  . Alcohol use No    Review  of Systems Per HPI unless specifically indicated above     Objective:    BP 118/86   Pulse 79   Temp 97.8 F (36.6 C) (Oral)   Resp 16   Ht 5\' 5"  (1.651 m)   Wt 238 lb (108 kg)   BMI 39.61 kg/m   Wt Readings from Last 3 Encounters:  07/21/16 238 lb (108 kg)  07/17/16 239 lb (108.4 kg)  05/29/16 242 lb (109.8 kg)    Physical Exam  Constitutional: She is oriented to person, place, and time. She appears well-developed and well-nourished. No distress.  Well-appearing, comfortable, cooperative  HENT:  Head: Normocephalic and atraumatic.  Cardiovascular: Normal rate, regular rhythm, normal heart sounds and intact distal pulses.   No murmur heard. Pulmonary/Chest: Effort normal and breath sounds normal. No respiratory distress. She has no wheezes. She has no rales. She exhibits no tenderness (No reproducible anterior chest wall tenderness).  Musculoskeletal: She exhibits no edema (Stable mild bilateral non pitting edema).  Lower extremities / calves non-tender to palpation. Appear symmetrical. No erythema. Unchanged since last visit  Neurological: She is alert and oriented to person, place, and time.  Skin: Skin is warm and dry. No rash noted. She is not diaphoretic.  Psychiatric: Her behavior is normal.  Nursing note and vitals reviewed.       Assessment & Plan:   Problem List Items Addressed This Visit    Other chest pain - Primary    Persistent unchanged mild non exertional chest pain (present >  2 months), active symptoms currently.Mixed features suspected atypical etiology, did not resolve or improve on muscle relaxant / tylenol / ASA. See last A&P for additional differential information. Today HR improved in 70s, BP normal, hemodynamically stable, well appearing. Well's PE (score 1.5, unlikely), EKG last visit reassuring without evidence of ischemia. No clinical evidence of DVT, same from last visit.  Plan: 1. Discussion with patient over past 2 visits regarding potential  risk for PE given her personal DVT and fam history, her risk is low but given chronicity of problem >2 months and worsening, discussed recommendation to proceed with D-dimer. Ordered STAT D-Dimer for [facility] draw, to go today, and will follow-up with patient on result. If negative, reassurance, follow-up chest pain within 1 month, continue conservative therapy, strict return criteria given, if positive, should go directly to ED for further labs and pursue Chest CTA to eval for possible PE, also advised may get additional CXR, EKG, chemistry with Cr, Troponin. - Future if no cardiac/PE etiology, consider maximizing anxiety control, may increase GERD treatment, consider referral to Cardiology for additional work up      Relevant Orders   D-Dimer, Quantitative   History of DVT (deep vein thrombosis)    No evidence of DVT, prior suspected provoked LLE DVT 2006, treated anticoag up to 3 years. Concern with fam history DVT/PE in both parents. Currently concerned for possible PE since last presentation 3 days ago, with persistent symptoms >2 months now. - STAT D-dimer, follow-up results, see A&P chest pain      Relevant Orders   D-Dimer, Quantitative      No orders of the defined types were placed in this encounter.     Follow up plan: Return in about 4 weeks (around 08/18/2016) for chest pain, anxiety.  Saralyn PilarAlexander Karamalegos, DO [facility] 07/21/2016, 11:51 AM

## 2016-07-21 NOTE — Patient Instructions (Signed)
Thank you for coming in to clinic today.  1. Ordered STAT D-dimer lab, take to [facility] and we will contact you as soon as we can with result, hopefully by end of day today. - If negative, highly unlikely to have blood clot in lungs or anywhere in body - If positive, this does not tell us exactly the problem, but higher risk for having a clot (unable to tell if chronic or new) - If positive, as discussed, highly recommend going straight to Emergency Department, and bringing paperwork, and request further work-up for possible chronic PE with Chest CT Scan with contrast (you will need other labs with kidney function as well in the hospital if this is the case), they may also check your heart enzyme (Troponin blood work) given the chest pain symptoms  Please schedule a follow-up appointment with Dr. Althea CharonKaramalegos in 1 to 3 months for chronic chest pain if results negative, or follow-up for other issues  If you have any other questions or concerns, please feel free to call the clinic or send a message through MyChart. You may also schedule an earlier appointment if necessary.  Saralyn PilarAlexander Karamalegos, DO [facility]

## 2016-08-04 ENCOUNTER — Ambulatory Visit (INDEPENDENT_AMBULATORY_CARE_PROVIDER_SITE_OTHER): Payer: 59 | Admitting: Family Medicine

## 2016-08-04 ENCOUNTER — Encounter: Payer: Self-pay | Admitting: Family Medicine

## 2016-08-04 VITALS — BP 118/79 | HR 80 | Temp 97.6°F | Resp 16 | Ht 65.0 in | Wt 241.0 lb

## 2016-08-04 DIAGNOSIS — M25462 Effusion, left knee: Secondary | ICD-10-CM | POA: Diagnosis not present

## 2016-08-04 DIAGNOSIS — M25562 Pain in left knee: Secondary | ICD-10-CM

## 2016-08-04 MED ORDER — FUROSEMIDE 20 MG PO TABS: 20.0000 mg | ORAL_TABLET | Freq: Every day | ORAL | 0 refills | Status: DC

## 2016-08-04 NOTE — Patient Instructions (Signed)
Thank you for coming in to clinic today.  1. For Left Knee swelling or effusion - Most likely from a knee sprain, hard to tell what the exact cause was, but this can happen occasionally - As discussed, I am reassured that you had the [facility] showing no blood clots - Try to really work on RICE therapy - R - rest and avoid prolonged standing (difficult to do at work) - I - ice packs 10-15 min on several times a day - C - compression, ACE wrap or knee sleeve, most of day especially at work - E - elevation, above heart level, or toes above nose  Recommend trial of Anti-inflammatory with OTC Aleve 220 to 250mg  tabs - take one to two with food and plenty of water TWICE daily every day (breakfast and dinner), for next 1-2 weeks, then you may take only as needed - DO NOT TAKE any ibuprofen, motrin while you are taking this medicine - It is safe to take Tylenol Ext Str 500mg  tabs - take 1 to 2 (max dose 1000mg ) every 6 hours as needed for breakthrough pain, max 24 hour daily dose is 6 to 8 tablets or 4000mg   Take lasix 20mg  daily for 3 days, you will urinate a lot as it is a strong fluid pill, blood work was normal when checked 07/21/16, this is only temporary relief from swelling, and then stop after 3 days you can use as needed for few more doses after that  Unlikely infection or gout as discussed  If swelling worsens, or painful redness, fever/chills return sooner or go directly back to hospital for re-evaluation, or contact me if not improving and we can arrange ortho follow-up for knee drainage  Please schedule a follow-up appointment with Dr. Althea CharonKaramalegos in 2 to 4 weeks to follow-up L knee pain swelling  If you have any other questions or concerns, please feel free to call the clinic or send a message through MyChart. You may also schedule an earlier appointment if necessary.  Saralyn PilarAlexander Jerryl Holzhauer, DO [facility], [facility]

## 2016-08-04 NOTE — Assessment & Plan Note (Addendum)
Acute L generalized knee pain associated with significant effusion / swelling without known injury or trauma. No known knee OA/DJD. No prior imaging. Unclear exact etiology, suspect may be due to mild strain, without instability or mechanical locking unlikely meniscus or acute ligament tear. Consider extension of chronic LLE edema with effusion. Considered baker's cyst, not seen on recent US doppler. Not consistent with joint infection or gout. No sign of DVT without any other lower extremity changes since last visit, no calf tenderness. Last LE US negative for DVT 07/21/16 - Able to bear weight today - No prior history of knee surgery, arthroscopy - Inadequate conservative therapy  Plan: 1. Start anti-inflammatory trial with OTC Aleve 220-250mg  BID wc x 1-2 weeks, then PRN 2. Start Tylenol 500-1000mg  per dose TID PRN breakthrough 3. RICE therapy (rest, ice, compression, elevation) for swelling, activity modification 4. Start Lasix 20mg  daily x 3 days, then may use remaining 3 pills PRN, only #6, discussed risks and not indicated to use this medication long term for chronic swelling, reviewed last labs with electrolytes and Cr normal 07/21/16 5. Hold imaging unlikely fracture based on no trauma or fall mechanism 5. Follow-up 2-4 weeks, if still worsening, consider referral to Ortho for further eval / may need knee drainage

## 2016-08-04 NOTE — Progress Notes (Signed)
Subjective:    Patient ID: Marcia Simmons, female    DOB: 04-13-1971, 45 y.o.   MRN: [id number]  Marcia Simmons is a 45 y.o. female presenting on 08/04/2016 for Knee Pain (swollen Left side onset week )   HPI   Left Knee Pain / Swelling - Reports new onset symptoms with acute onset Left knee pain about 10 days ago without known triggering incident or injury, initially with throbbing pain 5/10 moderate severity with intermittent episodes worse with using knee and walking, mostly complaints of feeling "pressure" in knee with swelling which is worsening or not improving, swelling worse with prolonged standing. - No prior history of similar joint swelling. No known gout. No known knee joint OA/DJD or prior injury / surgery / injection. - Still able to work, recently started new job doing a lot of sitting now - Recently seen by me 07/17/16 for chest pain and elevated d-dimer, with known PMH DVT, see last note for background information, she was urged to go to ED, and she went to [facility] on 07/21/16, had work up for this chronic recurrent chest pain, given elevated d-dimer she had bilateral LE dopplers, negative DVT, see below for details. Also had lab work and repeat EKG without evidence of cardiac etiology. - Admits mild left foot swelling - Denies any fevers/chills, red or warmth knee joint, other joint swelling or pain, fall, trauma, dyspnea, worsening chest pain  PMH - H/o L LE DVT  Social History  Substance Use Topics  . Smoking status: Never Smoker  . Smokeless tobacco: Never Used  . Alcohol use No    Review of Systems Per HPI unless specifically indicated above     Objective:    BP 118/79   Pulse 80   Temp 97.6 F (36.4 C) (Oral)   Resp 16   Ht 5' 5"  (1.651 m)   Wt 241 lb (109.3 kg)   BMI 40.10 kg/m   Wt Readings from Last 3 Encounters:  08/04/16 241 lb (109.3 kg)  07/21/16 238 lb (108 kg)  07/17/16 239 lb (108.4 kg)    Physical Exam  Constitutional:  She appears well-developed and well-nourished. No distress.  Well-appearing, comfortable, cooperative  HENT:  Head: Normocephalic and atraumatic.  Cardiovascular: Normal rate and intact distal pulses.   No murmur heard. Pulmonary/Chest: Effort normal.  Musculoskeletal: She exhibits no edema (Unchanged, stable mild bilateral non pitting edema lower extremities).  Left Knees Inspection: Left mild fullness appearance compared to Right. No erythema or ecchymosis. Appears to have moderate L knee joint effusion. Palpation: Non-tender. No warmth. Left lateral soft fluctuant effusion palpable increased anteriorly with knee flexion and moves posteriorly with knee extension. No acute joint line tenderness. No crepitus ROM: Full active ROM bilaterally, slightly reduced L knee ROM due to effusion Special Testing: Lachman / Valgus/Varus tests negative with intact ligaments (ACL, MCL, LCL). McMurray negative without meniscus symptoms. Standing Thessaly uncomfortable wt bearing L knee difficult to determine if pain due to movement or swelling. Strength: 5/5 intact knee flex/ext, ankle dorsi/plantarflex Neurovascular: distally intact sensation light touch and pulses  Lower extremities / calves non-tender to palpation. Appear symmetrical. No erythema. Unchanged since last visit  Neurological: She is alert.  Skin: Skin is warm and dry. No rash noted. She is not diaphoretic.  Nursing note and vitals reviewed.   I have personally reviewed the following lab results from 07/21/16 outside hospital [facility]. Chemistry Panel - Na 138, K 3.7, Cl 105, CO2 25, BUN  14, Creatinine 0.7, Glucose 85, Ca 9.0, AST 20, ALT 39, T Bili 0.7, Alk phos 81, Albumin 4.2, Total protein 7.1, GFR >60 CBC - WBC 6.0, Hgb 14.2, Hct 42.4, Plt 335, MCV 86 Cardiac Profile - CK 34, CK-MB 1, MB relative percent 3, Troponin-I < 0.01 Coag PT 11.8, INR 1.0   I have personally reviewed the radiology report from 07/21/16 bilateral US lower  extremity at outside hospital [facility], report only available in care everywhere.  EXAM:Bilateral Leg Ultrasound  INDICATION: Chest pain, leg swelling..  TECHNIQUE: Duplex sonography of the deep veins of both lower extremities was performed from the inguinal through popliteal regions utilizing real-time imaging, spectral Doppler, and color Doppler technology.  COMPARISON:None.  FINDINGS: The common femoral, superficial femoral, and popliteal veins show normal venous flow, compressibility, and flow augmentation. The visualized proximal calf vessels at the trifurcation are grossly patent. This study does not exclude deep venous thrombosis isolated to the calf veins.  IMPRESSION:No evidence of DVT in the bilateral lower extremities.  Electronically Signed PV:[id number] Cepeda, MD Electronically Signed on:07/21/2016 9:55 PM      Assessment & Plan:   Problem List Items Addressed This Visit    Effusion of left knee joint - Primary    Acute L generalized knee pain associated with significant effusion / swelling without known injury or trauma. No known knee OA/DJD. No prior imaging. Unclear exact etiology, suspect may be due to mild strain, without instability or mechanical locking unlikely meniscus or acute ligament tear. Consider extension of chronic LLE edema with effusion. Considered baker's cyst, not seen on recent US doppler. Not consistent with joint infection or gout. No sign of DVT without any other lower extremity changes since last visit, no calf tenderness. Last LE [place] negative for DVT 07/21/16 - Able to bear weight today - No prior history of knee surgery, arthroscopy - Inadequate conservative therapy  Plan: 1. Start anti-inflammatory trial with OTC Aleve 220-261m BID wc x 1-2 weeks, then PRN 2. Start Tylenol 500-10026mper dose TID PRN breakthrough 3. RICE therapy (rest, ice, compression, elevation) for swelling, activity modification 4. Start Lasix 2029mdaily x 3 days, then may use remaining 3 pills PRN, only #6, discussed risks and not indicated to use this medication long term for chronic swelling, reviewed last labs with electrolytes and Cr normal 07/21/16 5. Hold imaging unlikely fracture based on no trauma or fall mechanism 5. Follow-up 2-4 weeks, if still worsening, consider referral to Ortho for further eval / may need knee drainage       Relevant Medications   furosemide (LASIX) 20 MG tablet      Meds ordered this encounter  Medications  . furosemide (LASIX) 20 MG tablet    Sig: Take 1 tablet (20 mg total) by mouth daily. For 3 days, then stop and use last few doses as needed.    Dispense:  6 tablet    Refill:  0      Follow up plan: Return in about 4 weeks (around 09/01/2016), or if symptoms worsen or fail to improve, for left knee swelling and pain.  AleNobie PutnamO [facility] 08/04/2016, 6:03 PM

## 2016-08-14 NOTE — Addendum Note (Signed)
Addended by: Elvina MattesPATEL, Beatrice Ziehm D on: 08/14/2016 09:38 AM   Modules accepted: Orders

## 2016-08-24 ENCOUNTER — Encounter: Payer: Self-pay | Admitting: Family Medicine

## 2016-08-24 ENCOUNTER — Ambulatory Visit (INDEPENDENT_AMBULATORY_CARE_PROVIDER_SITE_OTHER): Payer: 59 | Admitting: Family Medicine

## 2016-08-24 ENCOUNTER — Other Ambulatory Visit: Payer: Self-pay | Admitting: Family Medicine

## 2016-08-24 VITALS — BP 126/82 | HR 75 | Temp 98.3°F | Resp 16 | Ht 65.0 in | Wt 240.0 lb

## 2016-08-24 DIAGNOSIS — M25462 Effusion, left knee: Secondary | ICD-10-CM | POA: Diagnosis not present

## 2016-08-24 DIAGNOSIS — M25562 Pain in left knee: Secondary | ICD-10-CM

## 2016-08-24 NOTE — Progress Notes (Signed)
Subjective:    Patient ID: Marcia Simmons, female    DOB: September 11, 1970, 45 y.o.   MRN: [id number]  Marcia Simmons is a 45 y.o. female presenting on 08/24/2016 for Knee Pain (getting worst Left side swollen on calf side and L foot swollen )  Patient presents for a same day appointment.  HPI   Left Knee Pain / Swelling - Last visit with me at [facility] 08/04/16, for same complaint with Left knee pain and swelling, without any known acute trigger, injury or fall. Review of prior onset of symptoms had initial throbbing 5/10 moderate severity pain mostly posterior L knee, intermittent worsening, described a "pressure" in knee with swelling, worse with walking, prolonged sitting or standing. She was treated with course of Naproxen, Tylenol for pain, and RICE therapy advised, also given brief course of Lasix 57m daily for 3-5 days for acute swelling. - Today she reports no improvement, and some worsening of her Left knee pain and swelling with some extension of swelling into her lower ankle/foot, however describes this swelling has been present for several weeks now as well. - Important recent history with elevated d-dimer and then Bilateral Lower Ext vascular ultrasound) [facility] for possible [facility], results were negative without DVT or baker's cyst on 07/21/16 - No prior history of similar joint swelling. No known gout. No known knee joint OA/DJD or prior injury / surgery / injection. - Denies any fevers/chills, red or warmth knee joint, other joint swelling or pain, fall, trauma, dyspnea, worsening chest pain  PMH - H/o L LE DVT (2006, thought to be provoked by OCPs, initially treated with coumadin for 3 years then off)   Social History  Substance Use Topics  . Smoking status: Never Smoker  . Smokeless tobacco: Never Used  . Alcohol use No    Review of Systems Per HPI unless specifically indicated above     Objective:    BP 126/82   Pulse 75   Temp 98.3  F (36.8 C) (Oral)   Resp 16   Ht 5' 5" (1.651 m)   Wt 240 lb (108.9 kg)   BMI 39.94 kg/m   Wt Readings from Last 3 Encounters:  08/24/16 240 lb (108.9 kg)  08/04/16 241 lb (109.3 kg)  07/21/16 238 lb (108 kg)    Physical Exam  Constitutional: She appears well-developed and well-nourished. No distress.  Well-appearing, comfortable, cooperative  HENT:  Head: Normocephalic and atraumatic.  Cardiovascular: Normal rate and intact distal pulses.   No murmur heard. Pulmonary/Chest: Effort normal.  Musculoskeletal: She exhibits edema (Unchanged, stable moderate L >R lower extremity non edema extending into L ankle and foot).  Left Knee - Exam essentially unchanged since last visit 08/04/16 Inspection: Persistent Left mild fullness appearance compared to Right. No erythema or ecchymosis. Appears to have mild-mod L knee joint effusion. Palpation: Non-tender. No warmth. Left lateral soft fluctuant effusion palpable increased anteriorly with knee flexion and moves posteriorly with knee extension. No acute joint line tenderness. No crepitus ROM: Full active ROM bilaterally, slightly reduced L knee ROM due to effusion Special Testing: Lachman / Valgus/Varus tests negative with intact ligaments (ACL, MCL, LCL). McMurray negative without meniscus symptoms. Standing Thessaly uncomfortable wt bearing L knee difficult to determine if pain due to movement or swelling. Strength: 5/5 intact knee flex/ext, ankle dorsi/plantarflex Neurovascular: distally intact sensation light touch and pulses  Lower extremities / calves non-tender to palpation. Appear symmetrical. No erythema. Unchanged since last visit  Neurological: She is alert.  Skin: Skin is warm and dry. No rash noted. She is not diaphoretic.  Nursing note and vitals reviewed.   I have personally reviewed the following lab results from 07/21/16 outside hospital [facility].  Chemistry Panel - Na 138, K 3.7, Cl 105, CO2 25, BUN 14, Creatinine  0.7, Glucose 85, Ca 9.0, AST 20, ALT 39, T Bili 0.7, Alk phos 81, Albumin 4.2, Total protein 7.1, GFR >60 CBC - WBC 6.0, Hgb 14.2, Hct 42.4, Plt 335, MCV 86 Cardiac Profile - CK 34, CK-MB 1, MB relative percent 3, Troponin-I < 0.01 Coag PT 11.8, INR 1.0   I have personally reviewed the radiology report from 07/21/16 bilateral US lower extremity at outside hospital [facility], report only available in care everywhere.  EXAM:Bilateral Leg Ultrasound  INDICATION: Chest pain, leg swelling..  TECHNIQUE: Duplex sonography of the deep veins of both lower extremities was performed from the inguinal through popliteal regions utilizing real-time imaging, spectral Doppler, and color Doppler technology.  COMPARISON:None.  FINDINGS: The common femoral, superficial femoral, and popliteal veins show normal venous flow, compressibility, and flow augmentation. The visualized proximal calf vessels at the trifurcation are grossly patent. This study does not exclude deep venous thrombosis isolated to the calf veins.  IMPRESSION:No evidence of DVT in the bilateral lower extremities.  Electronically Signed TR:[id number] Cepeda, MD Electronically Signed on:07/21/2016 9:55 PM      Assessment & Plan:   Problem List Items Addressed This Visit    Pain and swelling of left knee - Primary    Worsening subacute >1 month primarily posterior Left knee pain associated with significant effusion / swelling without known injury or trauma. No known knee OA/DJD. No prior imaging. Unclear exact etiology, suspect may be due to mild strain, without instability or mechanical locking unlikely meniscus or acute ligament tear. Consider extension of chronic LLE edema with effusion. Considered baker's cyst, not seen on recent US doppler. Not consistent with joint infection or gout. No sign of DVT without any other lower extremity changes since last visit, no calf tenderness. Last LE [place] negative for DVT 07/21/16 -  Able to bear weight today - No prior history of knee surgery, arthroscopy - No improvement on NSAIDs, Lasix (for edema), RICE therapy  Plan: 1. Urgent referral today to [facility], called and scheduled new patient patient with Carlynn Spry PA, today 1:30pm, [employer]. May need knee aspiration and fluid analysis, consider gout / pseudogout but history does not seem as suggestive, still consider baker's cyst 2. Advised can take Tylenol PRN, since no improvement on Naproxen or Ibuprofen 3. Should continue RICE therapy (rest, ice, compression, elevation) for swelling, activity modification 4. Stop Lasix 5. Defer X-rays to Ortho today 6. Follow-up 4-6 weeks as needed, otherwise likely follow-up with Ortho for this problem. Advised on strict return to ED/hospital criteria if any new or acute worsening symptoms more concerning for DVT      Relevant Orders   Ambulatory referral to Orthopedic Surgery      No orders of the defined types were placed in this encounter.     Follow up plan: Return in about 6 weeks (around 10/05/2016), or if symptoms worsen or fail to improve, for left knee pain and swelling.  Nobie Putnam, [facility] 08/24/2016, 12:26 PM

## 2016-08-24 NOTE — Patient Instructions (Addendum)
Thank you for coming in to clinic today.  1. For Left Knee swelling or effusion referral made to Orthopeidcs  Appointment is at 1:30pm today 08/24/16  [facility] Address: [address] [place], [place] [address] Hours: 9AM-5PM Phone: [phone]) [phone]  Allena KatzMaurice Jones Jr, PA-C  If not improved on ibuprofen can try Tylenol - Recommend to start taking Tylenol Extra Strength 500mg  tabs - take 1 to 2 tabs per dose (max 1000mg ) every 6-8 hours for pain (take regularly, don't skip a dose for next 7 days), max 24 hour daily dose is 6 tablets or 3000mg . In the future you can repeat the same everyday Tylenol course for 1-2 weeks at a time.   In future regardless of knee swelling cause, can try to keep up with RICE therapy - R - rest and avoid prolonged standing (difficult to do at work) - I - ice packs 10-15 min on several times a day - C - compression, ACE wrap or knee sleeve, most of day especially at work - E - elevation, above heart level, or toes above nose  If swelling worsens, or painful redness, fever/chills return sooner or go directly back to hospital for re-evaluation, or contact me if not improving and we can arrange ortho follow-up for knee drainage  Please schedule a follow-up appointment with Dr. Althea CharonKaramalegos in 4-6 weeks follow-up Left knee pain as needed, otherwise may just continue follow-up with Orthopedics for this problem  If you have any other questions or concerns, please feel free to call the clinic or send a message through MyChart. You may also schedule an earlier appointment if necessary.  Saralyn PilarAlexander Luria Rosario, DO [facility]

## 2016-08-24 NOTE — Assessment & Plan Note (Addendum)
Worsening subacute >1 month primarily posterior Left knee pain associated with significant effusion / swelling without known injury or trauma. No known knee OA/DJD. No prior imaging. Unclear exact etiology, suspect may be due to mild strain, without instability or mechanical locking unlikely meniscus or acute ligament tear. Consider extension of chronic LLE edema with effusion. Considered baker's cyst, not seen on recent US doppler. Not consistent with joint infection or gout. No sign of DVT without any other lower extremity changes since last visit, no calf tenderness. Last LE US negative for DVT 07/21/16 - Able to bear weight today - No prior history of knee surgery, arthroscopy - No improvement on NSAIDs, Lasix (for edema), RICE therapy  Plan: 1. Urgent referral today to [facility], called and scheduled new patient patient with Altamese CabalMaurice Jones PA, today 1:30pm, [place] [facility]. May need knee aspiration and fluid analysis, consider gout / pseudogout but history does not seem as suggestive, still consider baker's cyst 2. Advised can take Tylenol PRN, since no improvement on Naproxen or Ibuprofen 3. Should continue RICE therapy (rest, ice, compression, elevation) for swelling, activity modification 4. Stop Lasix 5. Defer X-rays to Ortho today 6. Follow-up 4-6 weeks as needed, otherwise likely follow-up with Ortho for this problem. Advised on strict return to ED/hospital criteria if any new or acute worsening symptoms more concerning for DVT

## 2017-04-13 ENCOUNTER — Other Ambulatory Visit: Payer: Self-pay | Admitting: Nurse Practitioner

## 2017-04-13 ENCOUNTER — Other Ambulatory Visit: Payer: Self-pay | Admitting: Family Medicine

## 2017-04-13 DIAGNOSIS — Z1231 Encounter for screening mammogram for malignant neoplasm of breast: Secondary | ICD-10-CM

## 2017-04-26 ENCOUNTER — Encounter: Payer: Self-pay | Admitting: Radiology

## 2017-04-26 ENCOUNTER — Ambulatory Visit
Admission: RE | Admit: 2017-04-26 | Discharge: 2017-04-26 | Disposition: A | Discharge status: Home / Self Care | Payer: 59 | Source: Ambulatory Visit | Attending: Nurse Practitioner | Admitting: Nurse Practitioner

## 2017-04-26 DIAGNOSIS — Z1231 Encounter for screening mammogram for malignant neoplasm of breast: Secondary | ICD-10-CM | POA: Insufficient documentation

## 2017-07-05 ENCOUNTER — Encounter: Payer: Self-pay | Admitting: Family Medicine

## 2017-07-05 ENCOUNTER — Ambulatory Visit (INDEPENDENT_AMBULATORY_CARE_PROVIDER_SITE_OTHER): Payer: 59 | Admitting: Family Medicine

## 2017-07-05 VITALS — BP 103/66 | HR 83 | Temp 97.9°F | Resp 16 | Ht 65.0 in | Wt 205.6 lb

## 2017-07-05 DIAGNOSIS — G4700 Insomnia, unspecified: Secondary | ICD-10-CM | POA: Insufficient documentation

## 2017-07-05 DIAGNOSIS — E669 Obesity, unspecified: Secondary | ICD-10-CM

## 2017-07-05 DIAGNOSIS — F330 Major depressive disorder, recurrent, mild: Secondary | ICD-10-CM | POA: Diagnosis not present

## 2017-07-05 DIAGNOSIS — K219 Gastro-esophageal reflux disease without esophagitis: Secondary | ICD-10-CM

## 2017-07-05 MED ORDER — OMEPRAZOLE 20 MG PO CPDR: 20.0000 mg | DELAYED_RELEASE_CAPSULE | Freq: Every day | ORAL | 3 refills | Status: DC

## 2017-07-05 MED ORDER — TRAZODONE HCL 100 MG PO TABS: 50.0000 mg | ORAL_TABLET | Freq: Every evening | ORAL | 2 refills | Status: DC | PRN

## 2017-07-05 MED ORDER — BUPROPION HCL ER (XL) 300 MG PO TB24: 300.0000 mg | ORAL_TABLET | Freq: Every day | ORAL | 3 refills | Status: DC

## 2017-07-05 NOTE — Progress Notes (Signed)
Subjective:    Patient ID: Marcia Simmons, female    DOB: [birth year], 46 y.o.   MRN: [id number]  Marcia Simmons is a 46 y.o. female presenting on 07/05/2017 for Depression   HPI   GERD: - Reports chronic history of GERD few years ago, and problem with dysphagia, saw ENT had a direct laryngoscopy, has been on PPI Omeprazole 20mg  daily OTC, but asking today for rx medicine on this, has occasional breakthrough symptoms rarely.  Depression / Anxiety / Insomnia - First visit discussing this problem today with me. Reports initial dx about age 28, started Zoloft regularly some improvement but did not remain effective eventually. No regular psychiatry or other therapy. - Currently taking Wellbutrin XL 300mg  - 24 hr tablet with some improvement in past. Today states she is not convinced it is controlling her symptoms - Tried Melatonin - Has tried Lexapro, Prozac, Effexor, Zoloft - Not interested in psych or counselor - Attributes a lot of her stress now to divorce within past 1 year, among other life stressors - Admits insomnia - Denies suicidal or homicidal  Weight Loss - Down 34-40 lbs in 10 months, states this is mostly intentional and some stress factors with reduced PO and wt loss. She has been on wt loss med before. Interested to resume this in future, her goal is to be below BMI < 30  Health Maintenance: - UTD Flu Shot 06/14/17 - Admits had prior routine HIV screen does not recall when, will check results  Depression screen [facility] 2/9 07/05/2017 05/29/2016 05/01/2016  Decreased Interest 1 1 2   Down, Depressed, Hopeless 1 1 2   PHQ - 2 Score 2 2 4   Altered sleeping 3 1 2   Tired, decreased energy 2 1 2   Change in appetite 1 0 0  Feeling bad or failure about yourself  1 1 1   Trouble concentrating 1 1 1   Moving slowly or fidgety/restless 0 0 2  Suicidal thoughts 0 0 0  PHQ-9 Score 10 6 12   Difficult doing work/chores Not difficult at all Somewhat difficult -   GAD 7 :  Generalized Anxiety Score 07/05/2017  Nervous, Anxious, on Edge 1  Control/stop worrying 1  Worry too much - different things 1  Trouble relaxing 1  Restless 1  Easily annoyed or irritable 1  Afraid - awful might happen 0  Total GAD 7 Score 6  Anxiety Difficulty Not difficult at all    Social History  Substance Use Topics  . Smoking status: Never Smoker  . Smokeless tobacco: Never Used  . Alcohol use No    Review of Systems Per HPI unless specifically indicated above     Objective:    BP 103/66   Pulse 83   Temp 97.9 F (36.6 C) (Oral)   Resp 16   Ht 5\' 5"  (1.651 m)   Wt 205 lb 9.6 oz (93.3 kg)   BMI 34.21 kg/m   Wt Readings from Last 3 Encounters:  07/05/17 205 lb 9.6 oz (93.3 kg)  08/24/16 240 lb (108.9 kg)  08/04/16 241 lb (109.3 kg)    Physical Exam  Constitutional: She is oriented to person, place, and time. She appears well-developed and well-nourished. No distress.  Well-appearing, comfortable, cooperative  HENT:  Head: Normocephalic and atraumatic.  Mouth/Throat: Oropharynx is clear and moist.  Eyes: Conjunctivae are normal. Right eye exhibits no discharge. Left eye exhibits no discharge.  Neck: Normal range of motion. Neck supple. No thyromegaly present.  Cardiovascular: Normal rate,  regular rhythm, normal heart sounds and intact distal pulses.   No murmur heard. Pulmonary/Chest: Effort normal and breath sounds normal. No respiratory distress. She has no wheezes. She has no rales.  Musculoskeletal: She exhibits no edema.  Lymphadenopathy:    She has no cervical adenopathy.  Neurological: She is alert and oriented to person, place, and time.  Skin: Skin is warm and dry. No rash noted. She is not diaphoretic. No erythema.  Psychiatric: She has a normal mood and affect. Her behavior is normal.  Well groomed, good eye contact, normal speech and thoughts  Nursing note and vitals reviewed.  Results for orders placed or performed in visit on 07/21/16    D-Dimer, Quantitative  Result Value Ref Range   D-DIMER 3.28 (H) 0.00 - 0.49 mg/L FEU      Assessment & Plan:   Problem List Items Addressed This Visit    Insomnia    See A&P for MDD Likely secondary to MDD/anxiety Start Trazodone, dose adjust Future follow-up may add other med      Relevant Medications   traZODone (DESYREL) 100 MG tablet   MDD (major depressive disorder), recurrent episode, mild (HCC) - Primary    Suspected chronic recently sub-optimally controlled MDD recurrent (for >25 years) and comorbid anxiety with insomnia - Primary stressor with life/family, divorce - Now ffecting more physically with fatigue from poor sleep, difficulty in describing trigger for insomnia -GAD7: 6, not difficult / PHQ9: 10 not - Failed: Lexapro, Prozac, Effexor, Zoloft - No prior dx / Psych / counseling  Plan: 1. Discussion on chronic diagnosis MDD anxiety, management, complications, likely contributing to insomnia. My concern today with refractory to several different meds and not seeking other mental health help 2. Start Trazodone 50-100mg  nightly (100mg  tabs, cut in half for 50mg  nightly 2-3 weeks if need additional improvement can take whole tab 100mg  nightly counseling on potential side effects risks - CONTINUE Wellbutrin XL 300mg  daily - advised take early in AM to avoid insomnia secondary - Considered add back SSRI but has failed most options for now - would consider Trintellix as other option, if can get approved 3. Advised recommend therapy / counseling in future -  4. Follow-up 6 weeks to 3 months pending improvement MDD/anxiety, med adjust, GAD7/PHQ9       Relevant Medications   traZODone (DESYREL) 100 MG tablet   buPROPion (WELLBUTRIN XL) 300 MG 24 hr tablet   Obesity (BMI 30.0-34.9)    Improved weight loss with lifestyle changes also concern with stress reduced PO - In past on wt loss med per bariatric clinic with phentermine  Plan: 1. Reviewed current Wellbutrin same  ingredient in Contrave, no plan to start at this time, since improved wt down, continue improve lifestyle and manage mood/stress if possible on new med adjust 2. May reconsider options - referral [facility], [facility], consider Contrave       Other Visit Diagnoses    Gastroesophageal reflux disease, esophagitis presence not specified       Relevant Medications   omeprazole (PRILOSEC) 20 MG capsule      Meds ordered this encounter  Medications  . MedroxyPROGESTERone Acetate 150 MG/ML SUSY    Sig: Inject into the muscle.  . traZODone (DESYREL) 100 MG tablet    Sig: Take 0.5-1 tablets (50-100 mg total) by mouth at bedtime as needed for sleep.    Dispense:  30 tablet    Refill:  2  . buPROPion (WELLBUTRIN XL) 300 MG 24  hr tablet    Sig: Take 1 tablet (300 mg total) by mouth daily.    Dispense:  90 tablet    Refill:  3  . omeprazole (PRILOSEC) 20 MG capsule    Sig: Take 1 capsule (20 mg total) by mouth daily before breakfast.    Dispense:  90 capsule    Refill:  3   Follow up plan: Return in about 6 weeks (around 08/16/2017) for Anxiety/Mood PHQ/GAD Insomnia med adjust.  No labs ordered today, will review outside [facility] results and add other orders if needed  Saralyn Pilar, DO [facility] 07/06/2017, 12:19 AM

## 2017-07-05 NOTE — Patient Instructions (Addendum)
Thank you for coming to the clinic today.  1.  Sent new rx Omeprazole 20mg  once daily take one capsule 30 min before first meal of day - Avoid spicy, greasy, fried foods, also things like caffeine, dark chocolate, peppermint can worsen - Avoid large meals and late night snacks, also do not go more than 4-5 hours without a snack or meal (not eating will worsen reflux symptoms due to stomach acid)  If the problem improves but keeps coming back, we can discuss higher dose or longer course at next visit.  If symptoms are worsening, persistent symptoms despite treatment or develop esophageal or abdominal pain, unable to swallow solids or liquids, nausea, vomiting, fever/chills, or unintentional weight loss / no appetite, please follow-up sooner or seek more immediate medical attention.  2. For mood / anxiety / insomnia - start new med Trazodone 100mg  - take HALF tablet nightly to help sleep and help these problems with mood. If after 2-3 weeks it is not helping you can DOUBLE dose and take 1 WHOLE tablet nightly.  Check to see if you had a prior routine HIV screen lab test - we need a copy of it or the date you had it done.  Bring us the lab results from [facility] recently and If needed I can order other labs for [facility]  Please schedule a Follow-up Appointment to: Return in about 6 weeks (around 08/16/2017) for Anxiety/Mood PHQ/GAD Insomnia med adjust.  If you have any other questions or concerns, please feel free to call the clinic or send a message through MyChart. You may also schedule an earlier appointment if necessary.  Additionally, you may be receiving a survey about your experience at our clinic within a few days to 1 week by e-mail or mail. We value your feedback.  Saralyn PilarAlexander Lorre Opdahl, DO [facility]

## 2017-07-06 ENCOUNTER — Encounter: Payer: Self-pay | Admitting: Family Medicine

## 2017-07-06 NOTE — Assessment & Plan Note (Signed)
Improved weight loss with lifestyle changes also concern with stress reduced PO - In past on wt loss med per bariatric clinic with phentermine  Plan: 1. Reviewed current Wellbutrin same ingredient in Contrave, no plan to start at this time, since improved wt down, continue improve lifestyle and manage mood/stress if possible on new med adjust 2. May reconsider options - referral [facility], [facility], consider Contrave

## 2017-07-06 NOTE — Assessment & Plan Note (Signed)
Suspected chronic recently sub-optimally controlled MDD recurrent (for >25 years) and comorbid anxiety with insomnia - Primary stressor with life/family, divorce - Now ffecting more physically with fatigue from poor sleep, difficulty in describing trigger for insomnia -GAD7: 6, not difficult / PHQ9: 10 not - Failed: Lexapro, Prozac, Effexor, Zoloft - No prior dx / Psych / counseling  Plan: 1. Discussion on chronic diagnosis MDD anxiety, management, complications, likely contributing to insomnia. My concern today with refractory to several different meds and not seeking other mental health help 2. Start Trazodone 50-100mg  nightly (100mg  tabs, cut in half for 50mg  nightly 2-3 weeks if need additional improvement can take whole tab 100mg  nightly counseling on potential side effects risks - CONTINUE Wellbutrin XL 300mg  daily - advised take early in AM to avoid insomnia secondary - Considered add back SSRI but has failed most options for now - would consider Trintellix as other option, if can get approved 3. Advised recommend therapy / counseling in future -  4. Follow-up 6 weeks to 3 months pending improvement MDD/anxiety, med adjust, GAD7/PHQ9

## 2017-07-06 NOTE — Assessment & Plan Note (Addendum)
See A&P for MDD Likely secondary to MDD/anxiety Start Trazodone, dose adjust Future follow-up may add other med

## 2017-12-06 ENCOUNTER — Ambulatory Visit: Payer: Self-pay | Admitting: Podiatry

## 2018-04-12 LAB — HM PAP SMEAR: HM PAP: NEGATIVE

## 2018-05-09 ENCOUNTER — Other Ambulatory Visit: Payer: Self-pay | Admitting: Family Medicine

## 2018-05-09 DIAGNOSIS — K219 Gastro-esophageal reflux disease without esophagitis: Secondary | ICD-10-CM

## 2018-05-09 DIAGNOSIS — F330 Major depressive disorder, recurrent, mild: Secondary | ICD-10-CM

## 2018-06-24 ENCOUNTER — Encounter: Payer: Self-pay | Admitting: Family Medicine

## 2018-07-26 ENCOUNTER — Other Ambulatory Visit: Payer: Self-pay | Admitting: Nurse Practitioner

## 2018-07-26 DIAGNOSIS — Z1231 Encounter for screening mammogram for malignant neoplasm of breast: Secondary | ICD-10-CM

## 2018-09-23 ENCOUNTER — Ambulatory Visit: Payer: 59 | Admitting: Family Medicine

## 2018-09-30 ENCOUNTER — Ambulatory Visit: Payer: Managed Care, Other (non HMO) | Admitting: Family Medicine

## 2019-03-09 ENCOUNTER — Ambulatory Visit (INDEPENDENT_AMBULATORY_CARE_PROVIDER_SITE_OTHER): Payer: Managed Care, Other (non HMO) | Admitting: Family Medicine

## 2019-03-09 ENCOUNTER — Other Ambulatory Visit: Payer: Self-pay

## 2019-03-09 ENCOUNTER — Encounter: Payer: Self-pay | Admitting: Family Medicine

## 2019-03-09 DIAGNOSIS — F330 Major depressive disorder, recurrent, mild: Secondary | ICD-10-CM

## 2019-03-09 DIAGNOSIS — F411 Generalized anxiety disorder: Secondary | ICD-10-CM | POA: Diagnosis not present

## 2019-03-09 DIAGNOSIS — K219 Gastro-esophageal reflux disease without esophagitis: Secondary | ICD-10-CM | POA: Diagnosis not present

## 2019-03-09 MED ORDER — BUPROPION HCL ER (XL) 300 MG PO TB24: 300.0000 mg | ORAL_TABLET | Freq: Every day | ORAL | 3 refills | Status: DC

## 2019-03-09 MED ORDER — OMEPRAZOLE 20 MG PO CPDR: 20.0000 mg | DELAYED_RELEASE_CAPSULE | Freq: Every day | ORAL | 3 refills | Status: DC

## 2019-03-09 MED ORDER — BUSPIRONE HCL 5 MG PO TABS: 5.0000 mg | ORAL_TABLET | Freq: Two times a day (BID) | ORAL | 2 refills | Status: DC

## 2019-03-09 NOTE — Patient Instructions (Addendum)
Omeprazole, Buproprion to [facility]  Buspar to [facility] locally - can increase dose in future to 10mg  twice a day or 5mg  3 times a day if need  Follow-up as needed if not improving  Therapy if need contact us for referral . Buena Irish, LCSW [address], [place]: [phone]   Self Referral:  1. [facility] [place]   Address: [place], [place], [place] [address] Hours: Open today  9AM-7PM Phone: [phone]  2. Cec[place], [place] Address: [address] [place], [place], [place] [address] Phone: [phone]   Please schedule a Follow-up Appointment to: Return in about 3 months (around 06/09/2019) for Follow-up 3 months mood anxiety.  If you have any other questions or concerns, please feel free to call the office or send a message through [place]. You may also schedule an earlier appointment if necessary.  Additionally, you may be receiving a survey about your experience at our office within a few days to 1 week by e-mail or mail. We value your feedback.  Nobie Putnam, DO [place]

## 2019-03-09 NOTE — Assessment & Plan Note (Signed)
Improved mood control Chronic problem >25 yrs Comorbid anxiety, insomnia Life stressors Improved PHQ. Elevated GAD - Failed: Lexapro, Prozac, Effexor, Zoloft, Trazodone - No prior dx / Psych / counseling  Plan: - Refill Wellbutrin XL 300mg  daily - advised take early in AM to avoid insomnia secondary - Defer repeat trial SSRI SNRI - Instead treat anxiety w/ Buspar Advised recommend therapy / counseling in future -  F/u 3 months pending improvement MDD/anxiety, med adjust, GAD7/PHQ9  Concerns still with refractory to several different meds and not seeking other mental health help

## 2019-03-09 NOTE — Assessment & Plan Note (Addendum)
Stable, controlled on PPI Secondary or complication with dysphagia, now controlled  Refill omeprazole 20mg  daily

## 2019-03-09 NOTE — Assessment & Plan Note (Signed)
See A&P Depression Without panic Continue Wellbutrin Failed  Multiple SSRI Trial on Buspar now for worsening anxiety, more generalized - 5mg  BID, reviewed dosing and potential side effect, may titrate dose to 5 TID vs 10 BID or split dosing

## 2019-03-09 NOTE — Progress Notes (Signed)
Virtual Visit via Telephone The purpose of this virtual visit is to provide medical care while limiting exposure to the novel coronavirus (COVID19) for both patient and office staff.  Consent was obtained for phone visit:  Yes.   Answered questions that patient had about telehealth interaction:  Yes.   I discussed the limitations, risks, security and privacy concerns of performing an evaluation and management service by telephone. I also discussed with the patient that there may be a patient responsible charge related to this service. The patient expressed understanding and agreed to proceed.  Patient Location: Home Provider [facility]  ---------------------------------------------------------------------- Chief Complaint  Patient presents with  . Medication Refill  . Anxiety    refill  . Gastroesophageal Reflux    refill    S: Reviewed CMA documentation. I have called patient and gathered additional HPI as follows:  GERD: Last visit 2018, needs refills - Reports chronic history of GERD few years ago, and problem with dysphagia in past with heartburn, saw ENT had a direct laryngoscopy, has been on PPI Omeprazole 20mg  daily with good results. - Today doing well needs refill to restart, her symptoms are controlled on medicine.  Major Depression, chronic recurrent mild / Anxiety / Insomnia - Last visit with me 06/2017, for same problems, see prior notes for background information. - Interval update with she has not followed up since 2018, overall has done well with mood, but now recently with stressors describes worse anxiety - Today patient reports daily anxiety, worse episodically but has some anxiety level every day, without panic attacks - Currently taking Wellbutrin XL 300mg  - 24 hr needs refill  - Tried Melatonin - Has tried Lexapro, Prozac, Effexor, Zoloft, Trazodone - Not interested in psych or counselor but may reconsider - Admits  insomnia - Denies suicidal or homicidal    Past Medical History:  Diagnosis Date  . DVT (deep venous thrombosis) (HCC)   . Gravida 1 para 1   . Migraines    Social History   Tobacco Use  . Smoking status: Never Smoker  . Smokeless tobacco: Never Used  Substance Use Topics  . Alcohol use: No    Alcohol/week: 0.0 standard drinks  . Drug use: No    Current Outpatient Medications:  .  buPROPion (WELLBUTRIN XL) 300 MG 24 hr tablet, Take 1 tablet (300 mg total) by mouth daily., Disp: 90 tablet, Rfl: 3 .  omeprazole (PRILOSEC) 20 MG capsule, Take 1 capsule (20 mg total) by mouth daily before breakfast., Disp: 90 capsule, Rfl: 3 .  busPIRone (BUSPAR) 5 MG tablet, Take 1 tablet (5 mg total) by mouth 2 (two) times daily., Disp: 60 tablet, Rfl: 2 .  MedroxyPROGESTERone Acetate 150 MG/ML SUSY, Inject into the muscle., Disp: , Rfl:   Depression screen [facility] 2/9 03/09/2019 07/05/2017 05/29/2016  Decreased Interest 1 1 1   Down, Depressed, Hopeless 0 1 1  PHQ - 2 Score 1 2 2   Altered sleeping 0 3 1  Tired, decreased energy 1 2 1   Change in appetite 0 1 0  Feeling bad or failure about yourself  0 1 1  Trouble concentrating 0 1 1  Moving slowly or fidgety/restless 0 0 0  Suicidal thoughts 0 0 0  PHQ-9 Score 2 10 6   Difficult doing work/chores Somewhat difficult Not difficult at all Somewhat difficult    GAD 7 : Generalized Anxiety Score 03/09/2019 07/05/2017  Nervous, Anxious, on Edge 0 1  Control/stop worrying 3 1  Worry  too much - different things 3 1  Trouble relaxing 3 1  Restless 3 1  Easily annoyed or irritable 2 1  Afraid - awful might happen 3 0  Total GAD 7 Score 17 6  Anxiety Difficulty Somewhat difficult Not difficult at all    -------------------------------------------------------------------------- O: No physical exam performed due to remote telephone encounter.  Lab results reviewed.  No results found for this or any previous visit (from the past 2160  hour(s)).  -------------------------------------------------------------------------- A&P:  Problem List Items Addressed This Visit    Gastroesophageal reflux disease - Primary    Stable, controlled on PPI Secondary or complication with dysphagia, now controlled  Refill omeprazole 20mg  daily      Relevant Medications   omeprazole (PRILOSEC) 20 MG capsule   Generalized anxiety disorder    See A&P Depression Without panic Continue Wellbutrin Failed  Multiple SSRI Trial on Buspar now for worsening anxiety, more generalized - 5mg  BID, reviewed dosing and potential side effect, may titrate dose to 5 TID vs 10 BID or split dosing      Relevant Medications   busPIRone (BUSPAR) 5 MG tablet   buPROPion (WELLBUTRIN XL) 300 MG 24 hr tablet   MDD (major depressive disorder), recurrent episode, mild (HCC)    Improved mood control Chronic problem >25 yrs Comorbid anxiety, insomnia Life stressors Improved PHQ. Elevated GAD - Failed: Lexapro, Prozac, Effexor, Zoloft, Trazodone - No prior dx / Psych / counseling  Plan: - Refill Wellbutrin XL 300mg  daily - advised take early in AM to avoid insomnia secondary - Defer repeat trial SSRI SNRI - Instead treat anxiety w/ Buspar Advised recommend therapy / counseling in future -  F/u 3 months pending improvement MDD/anxiety, med adjust, GAD7/PHQ9  Concerns still with refractory to several different meds and not seeking other mental health help       Relevant Medications   busPIRone (BUSPAR) 5 MG tablet   buPROPion (WELLBUTRIN XL) 300 MG 24 hr tablet      Meds ordered this encounter  Medications  . busPIRone (BUSPAR) 5 MG tablet    Sig: Take 1 tablet (5 mg total) by mouth 2 (two) times daily.    Dispense:  60 tablet    Refill:  2  . buPROPion (WELLBUTRIN XL) 300 MG 24 hr tablet    Sig: Take 1 tablet (300 mg total) by mouth daily.    Dispense:  90 tablet    Refill:  3  . omeprazole (PRILOSEC) 20 MG capsule    Sig: Take 1 capsule  (20 mg total) by mouth daily before breakfast.    Dispense:  90 capsule    Refill:  3    Follow-up: - Return in 3 months for anxiety mood meds  Due for future physical when ready  Patient verbalizes understanding with the above medical recommendations including the limitation of remote medical advice.  Specific follow-up and call-back criteria were given for patient to follow-up or seek medical care more urgently if needed.   - Time spent in direct consultation with patient on phone: 15 minutes  Nobie Putnam, [facility] 03/09/2019, 10:51 AM

## 2019-03-14 ENCOUNTER — Encounter: Payer: Self-pay | Admitting: Family Medicine

## 2019-03-14 ENCOUNTER — Other Ambulatory Visit: Payer: Self-pay

## 2019-03-14 ENCOUNTER — Ambulatory Visit (INDEPENDENT_AMBULATORY_CARE_PROVIDER_SITE_OTHER): Payer: Managed Care, Other (non HMO) | Admitting: Family Medicine

## 2019-03-14 VITALS — BP 113/77 | HR 99 | Temp 98.9°F | Resp 16 | Ht 65.0 in | Wt 207.0 lb

## 2019-03-14 DIAGNOSIS — M7989 Other specified soft tissue disorders: Secondary | ICD-10-CM

## 2019-03-14 DIAGNOSIS — Z86718 Personal history of other venous thrombosis and embolism: Secondary | ICD-10-CM

## 2019-03-14 MED ORDER — FUROSEMIDE 20 MG PO TABS: 20.0000 mg | ORAL_TABLET | Freq: Every day | ORAL | 0 refills | Status: DC | PRN

## 2019-03-14 NOTE — Patient Instructions (Addendum)
Thank you for coming to the office today.  Try the fluid pill Lasix 20mg  - once daily for a few days up to 3-5 per episode, can increase to 2 pills either at once or split dosing if needed.  Use RICE therapy: - R - Rest / relative rest with activity modification avoid overuse of joint - I - Ice packs (make sure you use a towel or sock / something to protect skin) - C - Compression with ACE wrap to apply pressure and reduce swelling allowing more support - E - Elevation - if significant swelling, lift leg above heart level (toes above your nose) to help reduce swelling, most helpful at night after day of being on your feet  ------------------------------------  Referral to Vein specialist sent - stay tuned  [facility] [place], [place] [address]  Main: [phone]   If severe change or worsening pain redness swelling or short of breath - go to hospital Emergency Dept.  Please schedule a Follow-up Appointment to: Return in about 4 weeks (around 04/11/2019), or if symptoms worsen or fail to improve, for edema.  If you have any other questions or concerns, please feel free to call the office or send a message through [place]. You may also schedule an earlier appointment if necessary.  Additionally, you may be receiving a survey about your experience at our office within a few days to 1 week by e-mail or mail. We value your feedback.  Nobie Putnam, DO [place]

## 2019-03-14 NOTE — Progress Notes (Signed)
Subjective:    Patient ID: Marcia Simmons, female    DOB: 1971/03/20, 48 y.o.   MRN: [id number]  Marcia Simmons is a 48 y.o. female presenting on 03/14/2019 for Leg Swelling (Right side getting worst from past week)   HPI   RIGHT Leg Swelling Reports symptoms worsening over past 3 months with R sided swelling, without any other symptoms, without pain or redness. - Known history of LLE DVT 2006, previously treated with anticoagulation, has known fam history of DVT PE in parents - Known chronic history some leg swelling, she wears compression stocking without improvement - Denies prolonged immobilization or travel - Denies dyspnea, cough, swelling in other leg or other joints, chest pain dyspnea   Depression screen [facility] 2/9 03/14/2019 03/09/2019 07/05/2017  Decreased Interest 0 1 1  Down, Depressed, Hopeless 0 0 1  PHQ - 2 Score 0 1 2  Altered sleeping 0 0 3  Tired, decreased energy 0 1 2  Change in appetite 0 0 1  Feeling bad or failure about yourself  0 0 1  Trouble concentrating 0 0 1  Moving slowly or fidgety/restless 1 0 0  Suicidal thoughts 0 0 0  PHQ-9 Score 1 2 10   Difficult doing work/chores Somewhat difficult Somewhat difficult Not difficult at all   GAD 7 : Generalized Anxiety Score 03/14/2019 03/09/2019 07/05/2017  Nervous, Anxious, on Edge 3 0 1  Control/stop worrying 3 3 1   Worry too much - different things 3 3 1   Trouble relaxing 3 3 1   Restless 2 3 1   Easily annoyed or irritable 2 2 1   Afraid - awful might happen 2 3 0  Total GAD 7 Score 18 17 6   Anxiety Difficulty Somewhat difficult Somewhat difficult Not difficult at all     Social History   Tobacco Use  . Smoking status: Never Smoker  . Smokeless tobacco: Never Used  Substance Use Topics  . Alcohol use: No    Alcohol/week: 0.0 standard drinks  . Drug use: No    Review of Systems Per HPI unless specifically indicated above     Objective:    BP 113/77   Pulse 99   Temp 98.9 F (37.2 C) (Oral)    Resp 16   Ht 5\' 5"  (1.651 m)   Wt 207 lb (93.9 kg)   BMI 34.45 kg/m   Wt Readings from Last 3 Encounters:  03/14/19 207 lb (93.9 kg)  07/05/17 205 lb 9.6 oz (93.3 kg)  08/24/16 240 lb (108.9 kg)    Physical Exam Vitals signs and nursing note reviewed.  Constitutional:      General: She is not in acute distress.    Appearance: She is well-developed. She is not diaphoretic.     Comments: Well-appearing, comfortable, cooperative  HENT:     Head: Normocephalic and atraumatic.  Eyes:     General:        Right eye: No discharge.        Left eye: No discharge.     Conjunctiva/sclera: Conjunctivae normal.  Neck:     Musculoskeletal: Normal range of motion and neck supple.     Thyroid: No thyromegaly.  Cardiovascular:     Rate and Rhythm: Normal rate and regular rhythm.     Heart sounds: Normal heart sounds. No murmur.  Pulmonary:     Effort: Pulmonary effort is normal. No respiratory distress.     Breath sounds: Normal breath sounds. No wheezing or rales.  Musculoskeletal: Normal  range of motion.     Right lower leg: Edema (Non pitting generalized ankle to below knee, no erythema, non tender) present.     Left lower leg: No edema.  Lymphadenopathy:     Cervical: No cervical adenopathy.  Skin:    General: Skin is warm and dry.     Findings: No erythema or rash.     Comments: Some spider veins on R lower extremity  Neurological:     Mental Status: She is alert and oriented to person, place, and time.  Psychiatric:        Behavior: Behavior normal.     Comments: Well groomed, good eye contact, normal speech and thoughts        Results for orders placed or performed in visit on 06/24/18  HM PAP SMEAR  Result Value Ref Range   HM Pap smear      Negative for intraepithelial cell or malignancy, negative pap      Assessment & Plan:   Problem List Items Addressed This Visit    History of DVT (deep vein thrombosis)   Relevant Orders   Ambulatory referral to Vascular  Surgery    Other Visit Diagnoses    Right leg swelling    -  Primary   Relevant Medications   furosemide (LASIX) 20 MG tablet   Other Relevant Orders   Ambulatory referral to Vascular Surgery     Clinically concern for worsening subacute on chronic LE edema, suspect multifactorial with venous insufficiency can be related to abnormal weight as well as other lifestyle factors - Concern for at risk of DVT in future, clinically without evidence of DVT Well's score 1 (low risk) no other concerning features today without pain or erythema - Known fam history DVT  Plan - Continue conservative care, improve compression elevation, RICE therapy - Add Furosemide 20-40mg  daily PRN for short course if need - Referral to Vascular for vein evaluation and US reflux testing and further management  Strict return criteria given patient agrees if any severe worsening when to go to hospital ED  Meds ordered this encounter  Medications  . furosemide (LASIX) 20 MG tablet    Sig: Take 1-2 tablets (20-40 mg total) by mouth daily as needed for edema. Take for up to 3-5 days at a time if needed, can increase dose.    Dispense:  30 tablet    Refill:  0     Orders Placed This Encounter  Procedures  . Ambulatory referral to Vascular Surgery    Referral Priority:   Routine    Referral Type:   Surgical    Referral Reason:   Specialty Services Required    Requested Specialty:   Vascular Surgery    Number of Visits Requested:   1      Follow up plan: Return in about 4 weeks (around 04/11/2019), or if symptoms worsen or fail to improve, for edema.  Saralyn PilarAlexander , DO [facility] 03/14/2019, 1:58 PM

## 2019-03-30 ENCOUNTER — Other Ambulatory Visit (INDEPENDENT_AMBULATORY_CARE_PROVIDER_SITE_OTHER): Payer: Self-pay | Admitting: Vascular Surgery

## 2019-03-30 DIAGNOSIS — Z8249 Family history of ischemic heart disease and other diseases of the circulatory system: Secondary | ICD-10-CM

## 2019-03-30 DIAGNOSIS — M7989 Other specified soft tissue disorders: Secondary | ICD-10-CM

## 2019-04-03 ENCOUNTER — Ambulatory Visit (INDEPENDENT_AMBULATORY_CARE_PROVIDER_SITE_OTHER): Payer: Managed Care, Other (non HMO) | Admitting: Vascular Surgery

## 2019-04-03 ENCOUNTER — Ambulatory Visit (INDEPENDENT_AMBULATORY_CARE_PROVIDER_SITE_OTHER): Payer: Managed Care, Other (non HMO)

## 2019-04-03 ENCOUNTER — Encounter (INDEPENDENT_AMBULATORY_CARE_PROVIDER_SITE_OTHER): Payer: Self-pay | Admitting: Vascular Surgery

## 2019-04-03 ENCOUNTER — Other Ambulatory Visit: Payer: Self-pay

## 2019-04-03 VITALS — BP 111/77 | HR 87 | Resp 16 | Ht 65.0 in | Wt 206.0 lb

## 2019-04-03 DIAGNOSIS — K219 Gastro-esophageal reflux disease without esophagitis: Secondary | ICD-10-CM | POA: Diagnosis not present

## 2019-04-03 DIAGNOSIS — I890 Lymphedema, not elsewhere classified: Secondary | ICD-10-CM

## 2019-04-03 DIAGNOSIS — Z8249 Family history of ischemic heart disease and other diseases of the circulatory system: Secondary | ICD-10-CM

## 2019-04-03 DIAGNOSIS — Z86718 Personal history of other venous thrombosis and embolism: Secondary | ICD-10-CM

## 2019-04-03 DIAGNOSIS — M7989 Other specified soft tissue disorders: Secondary | ICD-10-CM

## 2019-04-03 DIAGNOSIS — R600 Localized edema: Secondary | ICD-10-CM

## 2019-04-03 NOTE — Progress Notes (Signed)
MRN : [id number]  Marcia Simmons is a 48 y.o. (1970-11-29) female who presents with chief complaint of  Chief Complaint  Patient presents with  . New Patient (Initial Visit)    ref Marcia Simmons for rle edema   .  History of Present Illness:   Patient is seen for evaluation of leg swelling. The patient first noticed the swelling remotely but is now concerned because of a significant increase in the overall edema.  The right leg is more affected than the left.  The swelling is associated with minimal pain and  She notes some increased  discoloration. The patient notes that in the morning the legs are significantly improved but they steadily worsened throughout the course of the day. Elevation makes the legs better, dependency makes them much worse.   There is no history of ulcerations associated with the swelling.   The patient denies any recent changes in their medications.  The patient has been wearing graduated compression.  The patient has no had any past angiography, interventions or vascular surgery.  The patient has a remote history of left leg DVT but no PE. There is no prior history of phlebitis. There is no history of primary lymphedema.  There is no history of radiation treatment to the groin or pelvis No history of malignancies. No history of trauma or groin or pelvic surgery. No history of foreign travel or parasitic infections area    Current Meds  Medication Sig  . buPROPion (WELLBUTRIN XL) 300 MG 24 hr tablet Take 1 tablet (300 mg total) by mouth daily.  . busPIRone (BUSPAR) 5 MG tablet Take 1 tablet (5 mg total) by mouth 2 (two) times daily.  . furosemide (LASIX) 20 MG tablet Take 1-2 tablets (20-40 mg total) by mouth daily as needed for edema. Take for up to 3-5 days at a time if needed, can increase dose.  Marland Kitchen. omeprazole (PRILOSEC) 20 MG capsule Take 1 capsule (20 mg total) by mouth daily before breakfast.    Past Medical History:  Diagnosis Date  . DVT  (deep venous thrombosis) (HCC)   . Gravida 1 para 1   . Migraines     Past Surgical History:  Procedure Laterality Date  . APPENDECTOMY    . CHOLECYSTECTOMY      Social History Social History   Tobacco Use  . Smoking status: Never Smoker  . Smokeless tobacco: Never Used  Substance Use Topics  . Alcohol use: No    Alcohol/week: 0.0 standard drinks  . Drug use: No    Family History Family History  Problem Relation Age of Onset  . Cancer Mother        cervical  . Cancer Father        prostate  . Heart disease Father   . Breast cancer Neg Hx     Allergies  Allergen Reactions  . Medroxyprogesterone     Weight gain.     REVIEW OF SYSTEMS (Negative unless checked)  Constitutional: [] Weight loss  [] Fever  [] Chills Cardiac: [] Chest pain   [] Chest pressure   [] Palpitations   [] Shortness of breath when laying flat   [] Shortness of breath with exertion. Vascular:  [] Pain in legs with walking   [] Pain in legs at rest  [] History of DVT   [] Phlebitis   [x] Swelling in legs   [] Varicose veins   [] Non-healing ulcers Pulmonary:   [] Uses home oxygen   [] Productive cough   [] Hemoptysis   [] Wheeze  [] COPD   [] Asthma  Neurologic:  [] Dizziness   [] Seizures   [] History of stroke   [] History of TIA  [] Aphasia   [] Vissual changes   [] Weakness or numbness in arm   [] Weakness or numbness in leg Musculoskeletal:   [] Joint swelling   [] Joint pain   [] Low back pain Hematologic:  [] Easy bruising  [] Easy bleeding   [] Hypercoagulable state   [] Anemic Gastrointestinal:  [] Diarrhea   [] Vomiting  [x] Gastroesophageal reflux/heartburn   [] Difficulty swallowing. Genitourinary:  [] Chronic kidney disease   [] Difficult urination  [] Frequent urination   [] Blood in urine Skin:  [] Rashes   [] Ulcers  Psychological:  [] History of anxiety   []  History of major depression.  Physical Examination  Vitals:   04/03/19 1528  BP: 111/77  Pulse: 87  Resp: 16  Weight: 206 lb (93.4 kg)  Height: 5\' 5"  (1.651 m)    Body mass index is 34.28 kg/m. Gen: WD/WN, NAD Head: [place]/AT, No temporalis wasting.  Ear/Nose/Throat: Hearing grossly intact, nares w/o erythema or drainage Eyes: PER, EOMI, sclera nonicteric.  Neck: Supple, no large masses.   Pulmonary:  Good air movement, no audible wheezing bilaterally, no use of accessory muscles.  Cardiac: RRR, no JVD Vascular: scattered varicosities present bilaterally.  Mild venous stasis changes to the legs bilaterally.  3-4+ hard pitting edema of the right leg and 2-3+ pitting edema of the left Vessel Right Left  PT Palpable Palpable  DP Palpable Palpable  Gastrointestinal: Non-distended. No guarding/no peritoneal signs.  Musculoskeletal: M/S 5/5 throughout.  No deformity or atrophy.  Neurologic: CN 2-12 intact. Symmetrical.  Speech is fluent. Motor exam as listed above. Psychiatric: Judgment intact, Mood & affect appropriate for pt's clinical situation. Dermatologic: Mild venous rashes no ulcers noted.  No changes consistent with cellulitis. Lymph : No lichenification or skin changes of chronic lymphedema.  CBC No results found for: WBC, HGB, HCT, MCV, PLT  BMET No results found for: NA, K, CL, CO2, GLUCOSE, BUN, CREATININE, CALCIUM, GFRNONAA, GFRAA CrCl cannot be calculated (No successful lab value found.).  COAG No results found for: INR, PROTIME  Radiology No results found.   Assessment/Plan 1. Lymphedema I have had a long discussion with the patient regarding swelling and why it  causes symptoms.  Patient will begin wearing graduated compression stockings class 1 (20-30 mmHg) on a daily basis a prescription was given. The patient will  beginning wearing the stockings first thing in the morning and removing them in the evening. The patient is instructed specifically not to sleep in the stockings.   In addition, behavioral modification will be initiated.  This will include frequent elevation, use of over the counter pain medications and exercise such  as walking.  I have reviewed systemic causes for chronic edema such as liver, kidney and cardiac etiologies.  The patient denies problems with these organ systems.    Consideration for a lymph pump will also be made based upon the effectiveness of conservative therapy.  This would help to improve the edema control and prevent sequela such as ulcers and infections   Patient's duplex ultrasound of the venous system is negative for DVT and no reflux is identified.  The patient will follow-up with me after the ultrasound.    2. History of DVT (deep vein thrombosis) No indication for anticoagulation as this was a single episode from 14 years ago  3. Gastroesophageal reflux disease, esophagitis presence not specified Continue PPI as already ordered, this medication has been reviewed and there are no changes at this time.  Avoidence  of caffeine and alcohol  Moderate elevation of the head of the bed     Levora DredgeGregory Daine Gunther, MD  04/03/2019 4:00 PM

## 2019-06-29 ENCOUNTER — Ambulatory Visit
Admission: RE | Admit: 2019-06-29 | Discharge: 2019-06-29 | Disposition: A | Discharge status: Home / Self Care | Payer: Managed Care, Other (non HMO) | Source: Ambulatory Visit | Attending: Nurse Practitioner | Admitting: Nurse Practitioner

## 2019-06-29 ENCOUNTER — Other Ambulatory Visit: Payer: Self-pay

## 2019-06-29 DIAGNOSIS — Z1231 Encounter for screening mammogram for malignant neoplasm of breast: Secondary | ICD-10-CM

## 2019-07-05 ENCOUNTER — Ambulatory Visit (INDEPENDENT_AMBULATORY_CARE_PROVIDER_SITE_OTHER): Payer: Managed Care, Other (non HMO) | Admitting: Nurse Practitioner

## 2019-07-28 ENCOUNTER — Encounter (INDEPENDENT_AMBULATORY_CARE_PROVIDER_SITE_OTHER): Payer: Self-pay | Admitting: Nurse Practitioner

## 2020-01-01 ENCOUNTER — Other Ambulatory Visit: Payer: Self-pay | Admitting: Family Medicine

## 2020-01-01 DIAGNOSIS — K219 Gastro-esophageal reflux disease without esophagitis: Secondary | ICD-10-CM

## 2020-01-01 DIAGNOSIS — F330 Major depressive disorder, recurrent, mild: Secondary | ICD-10-CM

## 2020-01-01 NOTE — Telephone Encounter (Signed)
Requested Prescriptions  Pending Prescriptions Disp Refills  . omeprazole (PRILOSEC) 20 MG capsule [Pharmacy Med Name: OMEPRAZOLE  20MG   CAP] 90 capsule 0    Sig: TAKE 1 CAPSULE BY MOUTH  DAILY BEFORE BREAKFAST     Gastroenterology: Proton Pump Inhibitors Passed - 01/01/2020  9:26 PM      Passed - Valid encounter within last 12 months    Recent Outpatient Visits          9 months ago Right leg swelling   [facility] [place], [place], [place]   9 months ago Gastroesophageal reflux disease, esophagitis presence not specified   [facility], [facility], DO   2 years ago MDD (major depressive disorder), recurrent episode, mild (HCC)   [facility], [facility], DO   3 years ago Pain and swelling of left knee   [facility] [place], [place], [place]   3 years ago Effusion of left knee joint   [facility] [place], [place], [place]             . buPROPion (WELLBUTRIN XL) 300 MG 24 hr tablet [Pharmacy Med Name: BUPROPION  300MG   TAB  XL 24 HR] 90 tablet 3    Sig: TAKE 1 TABLET BY MOUTH  DAILY     Psychiatry: Antidepressants - bupropion Failed - 01/01/2020  9:26 PM      Failed - Valid encounter within last 6 months    Recent Outpatient Visits          9 months ago Right leg swelling   [facility] [place], [facility], DO   9 months ago Gastroesophageal reflux disease, esophagitis presence not specified   [facility] [place], [facility], DO   2 years ago MDD (major depressive disorder), recurrent episode, mild [facility])   [facility] [place], [facility], DO   3 years ago Pain and swelling of left knee   [facility] [place], [facility], DO   3 years ago Effusion of left knee joint   [facility] [place], [facility], DO             Passed - Completed PHQ-2 or PHQ-9 in the  last 360 days.      Passed - Last BP in normal range    BP Readings from Last 1 Encounters:  04/03/19 111/77

## 2020-03-22 ENCOUNTER — Other Ambulatory Visit: Payer: Self-pay | Admitting: Family Medicine

## 2020-03-22 DIAGNOSIS — F330 Major depressive disorder, recurrent, mild: Secondary | ICD-10-CM

## 2020-03-22 DIAGNOSIS — K219 Gastro-esophageal reflux disease without esophagitis: Secondary | ICD-10-CM

## 2020-03-22 NOTE — Telephone Encounter (Signed)
Requested  medications are  due for refill today yes  Requested medications are on the active medication list yes  Last refill 5/26  Last visit 03/14/2019  Future visit scheduled NO  Notes to clinic Failed protocol due to no visit withing 6 months or 12 months.

## 2020-06-11 ENCOUNTER — Other Ambulatory Visit: Payer: Self-pay | Admitting: Family Medicine

## 2020-06-11 DIAGNOSIS — K219 Gastro-esophageal reflux disease without esophagitis: Secondary | ICD-10-CM

## 2020-06-11 DIAGNOSIS — F330 Major depressive disorder, recurrent, mild: Secondary | ICD-10-CM

## 2020-06-11 NOTE — Telephone Encounter (Signed)
Requested medications are due for refill today? Yes   Requested medications are on active medication list?  Yes  Last Refill:  03/25/2020  # 90 with no refills for both meds.   With notation that patient must schedule an appt for future refills.    Future visit scheduled?   No   Notes to Clinic:  Medications failed Rx refill protocol due to no valid encounter in the past 6 months (Bupropion) nor 12 months (Omeprazole).  Last office visit was on 03/14/2019.

## 2020-06-17 ENCOUNTER — Other Ambulatory Visit: Payer: Self-pay | Admitting: Nurse Practitioner

## 2020-06-17 DIAGNOSIS — Z1231 Encounter for screening mammogram for malignant neoplasm of breast: Secondary | ICD-10-CM

## 2020-06-27 ENCOUNTER — Other Ambulatory Visit: Payer: Self-pay | Admitting: Family Medicine

## 2020-06-27 DIAGNOSIS — F330 Major depressive disorder, recurrent, mild: Secondary | ICD-10-CM

## 2020-06-27 DIAGNOSIS — K219 Gastro-esophageal reflux disease without esophagitis: Secondary | ICD-10-CM

## 2020-06-27 NOTE — Telephone Encounter (Signed)
Requested medications are due for refill today yes  Requested medications are on the active medication list yes  Last refill 8/11  Last visit 15 months ago  Future visit scheduled no  Notes to clinic Failed both protocols, Welbutrin for 6 month visit, Protonix for 1 year visit.

## 2020-07-02 ENCOUNTER — Encounter: Payer: Self-pay | Admitting: Family Medicine

## 2020-07-02 ENCOUNTER — Ambulatory Visit (INDEPENDENT_AMBULATORY_CARE_PROVIDER_SITE_OTHER): Payer: Managed Care, Other (non HMO) | Admitting: Family Medicine

## 2020-07-02 ENCOUNTER — Other Ambulatory Visit: Payer: Self-pay

## 2020-07-02 VITALS — BP 126/73 | HR 80 | Temp 97.5°F | Resp 16 | Ht 65.0 in | Wt 231.0 lb

## 2020-07-02 DIAGNOSIS — E669 Obesity, unspecified: Secondary | ICD-10-CM

## 2020-07-02 DIAGNOSIS — K219 Gastro-esophageal reflux disease without esophagitis: Secondary | ICD-10-CM

## 2020-07-02 DIAGNOSIS — F331 Major depressive disorder, recurrent, moderate: Secondary | ICD-10-CM

## 2020-07-02 MED ORDER — BUPROPION HCL ER (XL) 300 MG PO TB24: 300.0000 mg | ORAL_TABLET | Freq: Every day | ORAL | 1 refills | Status: DC

## 2020-07-02 MED ORDER — OMEPRAZOLE 20 MG PO CPDR: DELAYED_RELEASE_CAPSULE | ORAL | 0 refills | Status: DC

## 2020-07-02 MED ORDER — SERTRALINE HCL 100 MG PO TABS: ORAL_TABLET | ORAL | 1 refills | Status: DC

## 2020-07-02 NOTE — Patient Instructions (Addendum)
Thank you for coming to the office today.  Start back on Sertraline/Zoloft half tab of the 100 - for 50mg  daily for first 1-2 weeks then increase if needed.  SWITCH Wellbutrin to morning.  Stay tuned for apt for Psychiatry mental health and discuss medications  Past medications : Lexapro, Prozac, Effexor, Zoloft, Trazodone, Buspar  [facility] Address: [address], [place], [place] [email or web address] Phone: [phone]  optum should omeprazole and wellbutrin  Please schedule a Follow-up Appointment to: Return in about 3 months (around 10/02/2020), or if symptoms worsen or fail to improve, for 3 month as needed for mood, sleep, anxiety updates.  If you have any other questions or concerns, please feel free to call the office or send a message through MyChart. You may also schedule an earlier appointment if necessary.  Additionally, you may be receiving a survey about your experience at our office within a few days to 1 week by e-mail or mail. We value your feedback.  10/04/2020, DO [facility], [facility]

## 2020-07-02 NOTE — Progress Notes (Signed)
Subjective:    Patient ID: Marcia Simmons, female    DOB: 1971/05/27, 49 y.o.   MRN: [id number]  Marcia Simmons is a 50 y.o. female presenting on 07/02/2020 for Mass (mid-clavicle Right side) and Depression   HPI   Major Depression, chronic recurrent moderate / Anxiety / Insomnia Obesity BMI >38 - Last visit with me virtual visit 03/2019, for same problems, see prior notes for background information. - Interval update with previously had done well on medicines for mood and anxiety then since last visit multiple life stressors with COVID and work and family and other factors affecting her more now. - She admits worse depression and anxiety, she admits stress eating and weight gain 25 lbs in over 1 year  She was started on Buspirone but has since discontinued it. Ineffective vs side effect.  And she was taken off Trazodone  - Today patient reports daily anxiety, worse episodically but has some anxiety level every day, without panic attacks - Currently taking Wellbutrin XL 300mg  - 24 hr needs refill  - Tried Melatonin - Has tried Lexapro, Prozac, Effexor, Zoloft, Trazodone (couldn't function next day after tried Trazodone), Buspar - Not interested in psych or counselor but may reconsider - Admits insomnia - Denies suicidal or homicidal   Health Maintenance: UTD Flu Shot R arm, on 10/22 less than 1 week ago, few days later - identified a smaller sore lump above R clavicle / neck area, seemed to be mobile, today it has improved, few spots identified have improved. Less sore.  Depression screen [facility] 2/9 07/02/2020 03/14/2019 03/09/2019  Decreased Interest 2 0 1  Down, Depressed, Hopeless 2 0 0  PHQ - 2 Score 4 0 1  Altered sleeping 2 0 0  Tired, decreased energy 2 0 1  Change in appetite 3 0 0  Feeling bad or failure about yourself  2 0 0  Trouble concentrating 1 0 0  Moving slowly or fidgety/restless 1 1 0  Suicidal thoughts 0 0 0  PHQ-9 Score 15 1 2   Difficult doing work/chores  Not difficult at all Somewhat difficult Somewhat difficult   GAD 7 : Generalized Anxiety Score 07/02/2020 03/14/2019 03/09/2019 07/05/2017  Nervous, Anxious, on Edge 2 3 0 1  Control/stop worrying 3 3 3 1   Worry too much - different things 3 3 3 1   Trouble relaxing 2 3 3 1   Restless 1 2 3 1   Easily annoyed or irritable 2 2 2 1   Afraid - awful might happen 2 2 3  0  Total GAD 7 Score 15 18 17 6   Anxiety Difficulty Not difficult at all Somewhat difficult Somewhat difficult Not difficult at all     Social History   Tobacco Use  . Smoking status: Never Smoker  . Smokeless tobacco: Never Used  Substance Use Topics  . Alcohol use: Yes    Alcohol/week: 0.0 standard drinks  . Drug use: No    Review of Systems Per HPI unless specifically indicated above     Objective:    BP 126/73   Pulse 80   Temp (!) 97.5 F (36.4 C) (Temporal)   Resp 16   Ht 5\' 5"  (1.651 m)   Wt 231 lb (104.8 kg)   SpO2 100%   BMI 38.44 kg/m   Wt Readings from Last 3 Encounters:  07/02/20 231 lb (104.8 kg)  04/03/19 206 lb (93.4 kg)  03/14/19 207 lb (93.9 kg)    Physical Exam Vitals and nursing note reviewed.  Constitutional:      General: She is not in acute distress.    Appearance: She is well-developed. She is obese. She is not diaphoretic.     Comments: Well-appearing, comfortable, cooperative  HENT:     Head: Normocephalic and atraumatic.  Eyes:     General:        Right eye: No discharge.        Left eye: No discharge.     Conjunctiva/sclera: Conjunctivae normal.  Neck:     Comments: Unable to identify distinct lymphadenopathy or mass or nodule above R clavicle in area of concern Cardiovascular:     Rate and Rhythm: Normal rate.  Pulmonary:     Effort: Pulmonary effort is normal.  Musculoskeletal:     Cervical back: Normal range of motion and neck supple. No rigidity or tenderness.  Lymphadenopathy:     Cervical: No cervical adenopathy.  Skin:    General: Skin is warm and dry.      Findings: No erythema or rash.  Neurological:     Mental Status: She is alert and oriented to person, place, and time.  Psychiatric:        Behavior: Behavior normal.     Comments: Well groomed, good eye contact, normal speech and thoughts    Results for orders placed or performed in visit on 06/24/18  HM PAP SMEAR  Result Value Ref Range   HM Pap smear      Negative for intraepithelial cell or malignancy, negative pap      Assessment & Plan:   Problem List Items Addressed This Visit    Obesity (BMI 30.0-34.9)   Major depressive disorder, recurrent, moderate (HCC) - Primary    Now significant worsening mood control, seems long term episodic problem, inadequate control Worse with life stressors Chronic problem >25 yrs Comorbid anxiety, insomnia Improved PHQ. Elevated GAD - Failed: Lexapro, Prozac, Effexor, Zoloft, Trazodone, Buspirone - No prior dx / Psych / counseling  Plan: - REFERRAL to Psychiatry now given chronic problem, lack of improvement variety of failed meds. - Restart previous Sertraline that was mostly effective for years until benefit faded and she had been off for years. Start Sertraline 50mg  daily (HALF OF 100mg  tab) titrate up as advised if indicated - Refill Wellbutrin XL 300 - switch to AM dosing, think PM dose was causing insomnia - Stop Buspar F/u 3 months pending improvement MDD/anxiety, med adjust, GAD7/PHQ9      Relevant Medications   sertraline (ZOLOFT) 100 MG tablet   buPROPion (WELLBUTRIN XL) 300 MG 24 hr tablet   Other Relevant Orders   Ambulatory referral to Psychiatry   Gastroesophageal reflux disease   Relevant Medications   omeprazole (PRILOSEC) 20 MG capsule     #Right clavicular vs Neck nodule - Seems resolved. Acute onset within 1 week or less, on same side as Flu shot likely explanation may have been transient lymphadenopathy. - Past history of prior "LAD on R side of neck" as well 07/2016 however exam and history was also  unremarkable at that time of evaluation. Uncertain exact etiology, seems resolved onset within 1 week, should follow up if persistent >4-6 weeks we can consider ultrasound   Orders Placed This Encounter  Procedures  . Ambulatory referral to Psychiatry    Referral Priority:   Routine    Referral Type:   Psychiatric    Referral Reason:   Specialty Services Required    Requested Specialty:   Psychiatry    Number of Visits  Requested:   1      Meds ordered this encounter  Medications  . sertraline (ZOLOFT) 100 MG tablet    Sig: Start with half pill (50mg ) dose daily, then after 1-2 weeks or more can increase to 1 whole pill (100mg ) daily.    Dispense:  30 tablet    Refill:  1  . buPROPion (WELLBUTRIN XL) 300 MG 24 hr tablet    Sig: Take 1 tablet (300 mg total) by mouth daily.    Dispense:  90 tablet    Refill:  1  . omeprazole (PRILOSEC) 20 MG capsule    Sig: TAKE 1 CAPSULE BY MOUTH  DAILY BEFORE BREAKFAST    Dispense:  90 capsule    Refill:  0      Follow up plan: Return in about 3 months (around 10/02/2020), or if symptoms worsen or fail to improve, for 3 month as needed for mood, sleep, anxiety updates.   , DO [facility] 07/02/2020, 2:21 PM

## 2020-07-03 NOTE — Assessment & Plan Note (Addendum)
Now significant worsening mood control, seems long term episodic problem, inadequate control Worse with life stressors Chronic problem >25 yrs Comorbid anxiety, insomnia Improved PHQ. Elevated GAD - Failed: Lexapro, Prozac, Effexor, Zoloft, Trazodone, Buspirone - No prior dx / Psych / counseling  Plan: - REFERRAL to Psychiatry now given chronic problem, lack of improvement variety of failed meds. - Restart previous Sertraline that was mostly effective for years until benefit faded and she had been off for years. Start Sertraline 50mg  daily (HALF OF 100mg  tab) titrate up as advised if indicated - Refill Wellbutrin XL 300 - switch to AM dosing, think PM dose was causing insomnia - Stop Buspar F/u 3 months pending improvement MDD/anxiety, med adjust, GAD7/PHQ9

## 2020-07-16 ENCOUNTER — Other Ambulatory Visit: Payer: Self-pay

## 2020-07-16 ENCOUNTER — Ambulatory Visit
Admission: RE | Admit: 2020-07-16 | Discharge: 2020-07-16 | Disposition: A | Discharge status: Home / Self Care | Payer: Managed Care, Other (non HMO) | Source: Ambulatory Visit | Attending: Nurse Practitioner | Admitting: Nurse Practitioner

## 2020-07-16 DIAGNOSIS — Z1231 Encounter for screening mammogram for malignant neoplasm of breast: Secondary | ICD-10-CM

## 2020-08-26 ENCOUNTER — Other Ambulatory Visit: Payer: Self-pay | Admitting: Family Medicine

## 2020-08-26 DIAGNOSIS — K219 Gastro-esophageal reflux disease without esophagitis: Secondary | ICD-10-CM

## 2020-11-17 ENCOUNTER — Other Ambulatory Visit: Payer: Self-pay | Admitting: Family Medicine

## 2020-11-17 DIAGNOSIS — F331 Major depressive disorder, recurrent, moderate: Secondary | ICD-10-CM

## 2020-12-18 ENCOUNTER — Encounter: Payer: Self-pay | Admitting: Family Medicine

## 2020-12-18 ENCOUNTER — Other Ambulatory Visit: Payer: Self-pay

## 2020-12-18 ENCOUNTER — Telehealth (INDEPENDENT_AMBULATORY_CARE_PROVIDER_SITE_OTHER): Payer: Managed Care, Other (non HMO) | Admitting: Family Medicine

## 2020-12-18 DIAGNOSIS — R110 Nausea: Secondary | ICD-10-CM

## 2020-12-18 DIAGNOSIS — F331 Major depressive disorder, recurrent, moderate: Secondary | ICD-10-CM | POA: Diagnosis not present

## 2020-12-18 DIAGNOSIS — K219 Gastro-esophageal reflux disease without esophagitis: Secondary | ICD-10-CM

## 2020-12-18 MED ORDER — ONDANSETRON 4 MG PO TBDP: 4.0000 mg | ORAL_TABLET | Freq: Three times a day (TID) | ORAL | 0 refills | Status: DC | PRN

## 2020-12-18 MED ORDER — BUPROPION HCL ER (XL) 300 MG PO TB24: 1.0000 | ORAL_TABLET | Freq: Every day | ORAL | 3 refills | Status: DC

## 2020-12-18 MED ORDER — OMEPRAZOLE 20 MG PO CPDR: 20.0000 mg | DELAYED_RELEASE_CAPSULE | Freq: Every day | ORAL | 3 refills | Status: DC

## 2020-12-18 NOTE — Assessment & Plan Note (Signed)
Controlled on PPI Refill Omeprazole 20mg 

## 2020-12-18 NOTE — Progress Notes (Signed)
Subjective:    Patient ID: Marcia Simmons, female    DOB: 08/09/1971, 50 y.o.   MRN: [id number]  Marcia Simmons is a 50 y.o. female presenting on 12/18/2020 for Depression (Need refill on medication)  Virtual / Telehealth Encounter - Video Visit via MyChart The purpose of this virtual visit is to provide medical care while limiting exposure to the novel coronavirus (COVID19) for both patient and office staff.  Consent was obtained for remote visit:  Yes.   Answered questions that patient had about telehealth interaction:  Yes.   I discussed the limitations, risks, security and privacy concerns of performing an evaluation and management service by video/telephone. I also discussed with the patient that there may be a patient responsible charge related to this service. The patient expressed understanding and agreed to proceed.  Patient Location: Home Provider Location: [facility] (Office)  Participants in virtual visit: - Patient: Maeven "Orlie Pollen" Montez Morita - CMA: Burnell Blanks, CMA - Provider: Dr Althea Charon   HPI   MajorDepression, chronic recurrent moderate/ Anxiety / Insomnia Obesity BMI >38  - Last visit with me 06/2020, for initial visit for same problem, treated with Sertraline onset at 50mg  dose up to 100mg  as tolerated and referral to Psychiat[facility], see prior notes for background information. - Interval update with she tried Sertraline but did not continue it longer term. - Today patient reports she is doing better now, and remains off Sertraline, believes it made her fatigued or tired. She will need to re-schedule with  - Currently does not want to take other extra medication. - Taking Bupropion XL 300mg  daily - usually taking in PM. - Admits some insomnia, chronic problem. Anxiety improved, reduced panic attacks - Has tried Lexapro, Prozac, Effexor, Zoloft, Trazodone (couldn't function next day after tried Trazodone), Buspar, Melatonin,  Sertraline - Denies suicidal or homicidal  Additional history Viral Gastroenteritis recently, now improved but has residual nausea, tried dramamine limited results.    Depression screen [facility] 2/9 12/18/2020 07/02/2020 03/14/2019  Decreased Interest 0 2 0  Down, Depressed, Hopeless 0 2 0  PHQ - 2 Score 0 4 0  Altered sleeping 3 2 0  Tired, decreased energy 0 2 0  Change in appetite 3 3 0  Feeling bad or failure about yourself  1 2 0  Trouble concentrating 0 1 0  Moving slowly or fidgety/restless 0 1 1  Suicidal thoughts 0 0 0  PHQ-9 Score 7 15 1   Difficult doing work/chores Not difficult at all Not difficult at all Somewhat difficult  Some recent data might be hidden   GAD 7 : Generalized Anxiety Score 07/02/2020 03/14/2019 03/09/2019 07/05/2017  Nervous, Anxious, on Edge 2 3 0 1  Control/stop worrying 3 3 3 1   Worry too much - different things 3 3 3 1   Trouble relaxing 2 3 3 1   Restless 1 2 3 1   Easily annoyed or irritable 2 2 2 1   Afraid - awful might happen 2 2 3  0  Total GAD 7 Score 15 18 17 6   Anxiety Difficulty Not difficult at all Somewhat difficult Somewhat difficult Not difficult at all      Social History   Tobacco Use  . Smoking status: Never Smoker  . Smokeless tobacco: Never Used  Substance Use Topics  . Alcohol use: Yes    Alcohol/week: 0.0 standard drinks  . Drug use: No    Review of Systems Per HPI unless specifically indicated above  Objective:    There were no vitals taken for this visit.  Wt Readings from Last 3 Encounters:  07/02/20 231 lb (104.8 kg)  04/03/19 206 lb (93.4 kg)  03/14/19 207 lb (93.9 kg)    Physical Exam   Note examination was completely remotely via video observation objective data only  Gen - well-appearing, no acute distress or apparent pain, comfortable HEENT - eyes appear clear without discharge or redness Heart/Lungs - cannot examine virtually - observed no evidence of coughing or labored breathing. Abd - cannot  examine virtually  Skin - face visible today- no rash Neuro - awake, alert, oriented Psych - not anxious appearing   Results for orders placed or performed in visit on 06/24/18  HM PAP SMEAR  Result Value Ref Range   HM Pap smear      Negative for intraepithelial cell or malignancy, negative pap      Assessment & Plan:   Problem List Items Addressed This Visit    Major depressive disorder, recurrent, moderate (HCC) - Primary    Improved mood and anxiety Still chronic problem > 25 yrs Comorbid anxiety, insomnia Improved PHQ. Elevated GAD - Failed: Lexapro, Prozac, Effexor, Zoloft, Trazodone, Buspirone - No prior dx / Psych / counseling  Plan: She will re-schedule Psychiatry apt, was referred 06/2020, unable to make apt, [facility] - she still wants to pursue this. - Remain off Sertraline - Continue Wellbutrin XL 300mg  daily, refill today - she was advised to take in AM or afternoon, avoid taking late can worsen insomnia.      Relevant Medications   buPROPion (WELLBUTRIN XL) 300 MG 24 hr tablet   Gastroesophageal reflux disease    Controlled on PPI Refill Omeprazole 20mg       Relevant Medications   ondansetron (ZOFRAN ODT) 4 MG disintegrating tablet   omeprazole (PRILOSEC) 20 MG capsule    Other Visit Diagnoses    Nausea       Relevant Medications   ondansetron (ZOFRAN ODT) 4 MG disintegrating tablet      Meds ordered this encounter  Medications  . ondansetron (ZOFRAN ODT) 4 MG disintegrating tablet    Sig: Take 1 tablet (4 mg total) by mouth every 8 (eight) hours as needed for nausea or vomiting.    Dispense:  30 tablet    Refill:  0  . buPROPion (WELLBUTRIN XL) 300 MG 24 hr tablet    Sig: Take 1 tablet (300 mg total) by mouth daily.    Dispense:  90 tablet    Refill:  3  . omeprazole (PRILOSEC) 20 MG capsule    Sig: Take 1 capsule (20 mg total) by mouth daily before breakfast.    Dispense:  90 capsule    Refill:  3    Requesting 1 year supply      Follow up plan: Return in about 2 months (around 02/17/2021) for 2-3 month annual physical, print [facility] orders at visit.  Future [facility] orders will print during her upcoming visit.   Patient verbalizes understanding with the above medical recommendations including the limitation of remote medical advice.  Specific follow-up and call-back criteria were given for patient to follow-up or seek medical care more urgently if needed.  Total duration of direct patient care provided via video conference: 15 minutes   , DO [facility] 12/18/2020, 11:01 AM

## 2020-12-18 NOTE — Assessment & Plan Note (Signed)
Improved mood and anxiety Still chronic problem > 25 yrs Comorbid anxiety, insomnia Improved PHQ. Elevated GAD - Failed: Lexapro, Prozac, Effexor, Zoloft, Trazodone, Buspirone - No prior dx / Psych / counseling  Plan: She will re-schedule Psychiatry apt, was referred 06/2020, unable to make apt, [facility] - she still wants to pursue this. - Remain off Sertraline - Continue Wellbutrin XL 300mg  daily, refill today - she was advised to take in AM or afternoon, avoid taking late can worsen insomnia.

## 2020-12-18 NOTE — Patient Instructions (Addendum)
     Please schedule a Follow-up Appointment to: Return in about 2 months (around 02/17/2021) for 2-3 month annual physical, print [facility] orders at visit.  If you have any other questions or concerns, please feel free to call the office or send a message through MyChart. You may also schedule an earlier appointment if necessary.  Additionally, you may be receiving a survey about your experience at our office within a few days to 1 week by e-mail or mail. We value your feedback.  Saralyn Pilar, DO [facility] [place], [place]

## 2021-01-10 ENCOUNTER — Other Ambulatory Visit: Payer: Self-pay | Admitting: Family Medicine

## 2021-01-10 ENCOUNTER — Encounter: Payer: Self-pay | Admitting: Family Medicine

## 2021-01-10 ENCOUNTER — Other Ambulatory Visit: Payer: Self-pay

## 2021-01-10 ENCOUNTER — Ambulatory Visit (INDEPENDENT_AMBULATORY_CARE_PROVIDER_SITE_OTHER): Payer: Managed Care, Other (non HMO) | Admitting: Family Medicine

## 2021-01-10 VITALS — BP 118/74 | HR 79 | Ht 65.0 in | Wt 239.4 lb

## 2021-01-10 DIAGNOSIS — I890 Lymphedema, not elsewhere classified: Secondary | ICD-10-CM | POA: Diagnosis not present

## 2021-01-10 DIAGNOSIS — R600 Localized edema: Secondary | ICD-10-CM

## 2021-01-10 DIAGNOSIS — Z86718 Personal history of other venous thrombosis and embolism: Secondary | ICD-10-CM | POA: Diagnosis not present

## 2021-01-10 DIAGNOSIS — R7309 Other abnormal glucose: Secondary | ICD-10-CM

## 2021-01-10 DIAGNOSIS — Z0000 Encounter for general adult medical examination without abnormal findings: Secondary | ICD-10-CM

## 2021-01-10 DIAGNOSIS — M7989 Other specified soft tissue disorders: Secondary | ICD-10-CM

## 2021-01-10 DIAGNOSIS — Z1159 Encounter for screening for other viral diseases: Secondary | ICD-10-CM

## 2021-01-10 MED ORDER — FUROSEMIDE 20 MG PO TABS: 20.0000 mg | ORAL_TABLET | Freq: Every day | ORAL | 2 refills | Status: AC | PRN

## 2021-01-10 NOTE — Progress Notes (Signed)
Subjective:    Patient ID: Marcia Simmons, female    DOB: 1971/08/05, 50 y.o.   MRN: [id number]  Marcia Simmons is a 50 y.o. female presenting on 01/10/2021 for Leg Swelling and Joint Swelling   HPI   Left Leg Swelling Hx bilateral lower ext edema Hx LLE DVT 2006 Lymphedema  Recent problem flare L leg swelling. Not painful or not consistent with DVT by her report, wanted to get it checked out. Last discussion on swelling 03/2019, she had been referred to [facility], saw them in 03/2019 had venous US done R side, no insufficiency Admits some weight gain Improved diet, increased walking  She wears compression socks daily, elevation some improvement in past was on Furosemide PRN with some relief out of med.  - Known history of LLE DVT 2006, previously treated with anticoagulation, has known fam history of DVT PE in parents - Denies prolonged immobilization or travel - Denies dyspnea, cough, swelling in other leg or other joints, chest pain dyspnea  History of plantar fasciitis / bilateral foot pain and has seen podiatry    Depression screen [facility] 2/9 01/10/2021 12/18/2020 07/02/2020  Decreased Interest 0 0 2  Down, Depressed, Hopeless 1 0 2  PHQ - 2 Score 1 0 4  Altered sleeping 2 3 2   Tired, decreased energy 3 0 2  Change in appetite 2 3 3   Feeling bad or failure about yourself  0 1 2  Trouble concentrating 0 0 1  Moving slowly or fidgety/restless 0 0 1  Suicidal thoughts 0 0 0  PHQ-9 Score 8 7 15   Difficult doing work/chores Somewhat difficult Not difficult at all Not difficult at all  Some recent data might be hidden    Social History   Tobacco Use  . Smoking status: Never Smoker  . Smokeless tobacco: Never Used  Substance Use Topics  . Alcohol use: Yes    Alcohol/week: 0.0 standard drinks  . Drug use: No    Review of Systems Per HPI unless specifically indicated above     Objective:    BP 118/74   Pulse 79   Ht 5\' 5"  (1.651 m)   Wt 239 lb 6.4 oz (108.6  kg)   SpO2 100%   BMI 39.84 kg/m   Wt Readings from Last 3 Encounters:  01/10/21 239 lb 6.4 oz (108.6 kg)  07/02/20 231 lb (104.8 kg)  04/03/19 206 lb (93.4 kg)    Physical Exam Vitals and nursing note reviewed.  Constitutional:      General: She is not in acute distress.    Appearance: She is well-developed. She is not diaphoretic.     Comments: Well-appearing, comfortable, cooperative  HENT:     Head: Normocephalic and atraumatic.  Eyes:     General:        Right eye: No discharge.        Left eye: No discharge.     Conjunctiva/sclera: Conjunctivae normal.  Cardiovascular:     Rate and Rhythm: Normal rate.  Pulmonary:     Effort: Pulmonary effort is normal.  Musculoskeletal:     Right lower leg: Edema present.     Left lower leg: Edema (generalized, pitting edema, non tender. no erythema) present.  Skin:    General: Skin is warm and dry.     Findings: No erythema or rash.  Neurological:     Mental Status: She is alert and oriented to person, place, and time.  Psychiatric:  Behavior: Behavior normal.     Comments: Well groomed, good eye contact, normal speech and thoughts    Results for orders placed or performed in visit on 06/24/18  HM PAP SMEAR  Result Value Ref Range   HM Pap smear      Negative for intraepithelial cell or malignancy, negative pap      Assessment & Plan:   Problem List Items Addressed This Visit    Lymphedema   History of DVT (deep vein thrombosis)    Other Visit Diagnoses    Bilateral lower extremity edema    -  Primary   Right leg swelling       Relevant Medications   furosemide (LASIX) 20 MG tablet       Clinically concern for chronic bilateral LE edema, now L flared up Hx prior DVT LLE 2006, chronic swelling, lymphedema chronic problem Prior vasc US 2020 did not show venous insufficiency RLE - Concern for at risk of DVT in future, clinically without evidence of DVT Well's score 1 (low risk) no other concerning features  today without pain or erythema - Known fam history DVT  Plan - Continue conservative care, improve compression elevation, RICE therapy - Repeat - Add Furosemide 20-40mg  daily PRN for short course if need - may need longer, will check labs soon within 1 month - Advised return to Vascular for eval of L leg and reconsider the Lymphedema pump options.  Strict return criteria given patient agrees if any severe worsening when to go to hospital ED  Meds ordered this encounter  Medications  . furosemide (LASIX) 20 MG tablet    Sig: Take 1-2 tablets (20-40 mg total) by mouth daily as needed for edema. Take for up to 3-5 days at a time if needed, can increase dose.    Dispense:  30 tablet    Refill:  2      Follow up plan: Return in about 4 weeks (around 02/07/2021) for 4 weeks annual physical can be AM or PM, [facility] week prior.  Printed [facility] orders given to her, return within 1 month   Saralyn Pilar, DO [facility] 01/10/2021, 10:50 AM

## 2021-01-10 NOTE — Patient Instructions (Addendum)
Thank you for coming to the office today.  ReTry the fluid pill Lasix 20mg  - once daily for a few days up to 3-5 per episode, can increase to 2 pills either at once or split dosing if needed.  Use RICE therapy: - R - Rest / relative rest with activity modification avoid overuse of joint - I - Ice packs (make sure you use a towel or sock / something to protect skin) - C - Compression with ACE wrap to apply pressure and reduce swelling allowing more support - E - Elevation - if significant swelling, lift leg above heart level (toes above your nose) to help reduce swelling, most helpful at night after day of being on your feet  ------------------------------------  Call Vein Vascular specialists once more and try to return to them - you saw Dr back in 03/2019 - had R leg vascular ultrasound  It sounds like Lymphedema may be the problem, Left may have vein insufficiency too, may need ultrasound  They can work on Lymphedema pump  [facility] [address] [place], [place]  Main: [phone]   Please schedule a Follow-up Appointment to: Return in about 4 weeks (around 02/07/2021) for 4 weeks annual physical can be AM or PM, [facility] week prior.  If you have any other questions or concerns, please feel free to call the office or send a message through MyChart. You may also schedule an earlier appointment if necessary.  Additionally, you may be receiving a survey about your experience at our office within a few days to 1 week by e-mail or mail. We value your feedback.  04/09/2021, DO [facility], [facility]

## 2021-02-07 ENCOUNTER — Encounter: Payer: Managed Care, Other (non HMO) | Admitting: Family Medicine

## 2021-02-12 ENCOUNTER — Encounter: Payer: Managed Care, Other (non HMO) | Admitting: Family Medicine

## 2021-02-18 LAB — CBC WITH DIFFERENTIAL/PLATELET
Basophils Absolute: 0 10*3/uL (ref 0.0–0.2)
Basos: 0 %
EOS (ABSOLUTE): 0.1 10*3/uL (ref 0.0–0.4)
Eos: 2 %
Hematocrit: 38.8 % (ref 34.0–46.6)
Hemoglobin: 11.7 g/dL (ref 11.1–15.9)
Immature Grans (Abs): 0 10*3/uL (ref 0.0–0.1)
Immature Granulocytes: 0 %
Lymphocytes Absolute: 2.4 10*3/uL (ref 0.7–3.1)
Lymphs: 32 %
MCH: 24 pg — ABNORMAL LOW (ref 26.6–33.0)
MCHC: 30.2 g/dL — ABNORMAL LOW (ref 31.5–35.7)
MCV: 80 fL (ref 79–97)
Monocytes Absolute: 0.7 10*3/uL (ref 0.1–0.9)
Monocytes: 9 %
Neutrophils Absolute: 4.4 10*3/uL (ref 1.4–7.0)
Neutrophils: 57 %
Platelets: 384 10*3/uL (ref 150–450)
RBC: 4.88 x10E6/uL (ref 3.77–5.28)
RDW: 15.5 % — ABNORMAL HIGH (ref 11.7–15.4)
WBC: 7.7 10*3/uL (ref 3.4–10.8)

## 2021-02-18 LAB — COMPREHENSIVE METABOLIC PANEL
ALT: 10 IU/L (ref 0–32)
AST: 7 IU/L (ref 0–40)
Albumin/Globulin Ratio: 2.8 — ABNORMAL HIGH (ref 1.2–2.2)
Albumin: 4.5 g/dL (ref 3.8–4.8)
Alkaline Phosphatase: 103 IU/L (ref 44–121)
BUN/Creatinine Ratio: 15 (ref 9–23)
BUN: 12 mg/dL (ref 6–24)
Bilirubin Total: 0.2 mg/dL (ref 0.0–1.2)
CO2: 21 mmol/L (ref 20–29)
Calcium: 8.9 mg/dL (ref 8.7–10.2)
Chloride: 105 mmol/L (ref 96–106)
Creatinine, Ser: 0.78 mg/dL (ref 0.57–1.00)
Globulin, Total: 1.6 g/dL (ref 1.5–4.5)
Glucose: 93 mg/dL (ref 65–99)
Potassium: 4.1 mmol/L (ref 3.5–5.2)
Sodium: 142 mmol/L (ref 134–144)
Total Protein: 6.1 g/dL (ref 6.0–8.5)
eGFR: 94 mL/min/{1.73_m2} (ref >59)

## 2021-02-18 LAB — LIPID PANEL
Chol/HDL Ratio: 2.7 ratio (ref 0.0–4.4)
Cholesterol, Total: 153 mg/dL (ref 100–199)
HDL: 56 mg/dL (ref >39)
LDL Chol Calc (NIH): 68 mg/dL (ref 0–99)
Triglycerides: 176 mg/dL — ABNORMAL HIGH (ref 0–149)
VLDL Cholesterol Cal: 29 mg/dL (ref 5–40)

## 2021-02-18 LAB — BRAIN NATRIURETIC PEPTIDE: BNP: 29.6 pg/mL (ref 0.0–100.0)

## 2021-02-18 LAB — HEMOGLOBIN A1C
Est. average glucose Bld gHb Est-mCnc: 108 mg/dL
Hgb A1c MFr Bld: 5.4 % (ref 4.8–5.6)

## 2021-02-18 LAB — T4, FREE: Free T4: 1.27 ng/dL (ref 0.82–1.77)

## 2021-02-18 LAB — HEPATITIS C ANTIBODY: Hep C Virus Ab: <0.1 s/co ratio (ref 0.0–0.9)

## 2021-02-18 LAB — TSH: TSH: 1.97 u[IU]/mL (ref 0.450–4.500)

## 2021-02-21 ENCOUNTER — Ambulatory Visit (INDEPENDENT_AMBULATORY_CARE_PROVIDER_SITE_OTHER): Payer: Managed Care, Other (non HMO) | Admitting: Family Medicine

## 2021-02-21 ENCOUNTER — Other Ambulatory Visit: Payer: Self-pay

## 2021-02-21 ENCOUNTER — Encounter: Payer: Self-pay | Admitting: Family Medicine

## 2021-02-21 VITALS — BP 116/75 | HR 85 | Ht 65.0 in | Wt 236.8 lb

## 2021-02-21 DIAGNOSIS — E669 Obesity, unspecified: Secondary | ICD-10-CM | POA: Diagnosis not present

## 2021-02-21 DIAGNOSIS — Z1211 Encounter for screening for malignant neoplasm of colon: Secondary | ICD-10-CM

## 2021-02-21 DIAGNOSIS — F331 Major depressive disorder, recurrent, moderate: Secondary | ICD-10-CM | POA: Diagnosis not present

## 2021-02-21 DIAGNOSIS — Z0000 Encounter for general adult medical examination without abnormal findings: Secondary | ICD-10-CM

## 2021-02-21 DIAGNOSIS — Z1231 Encounter for screening mammogram for malignant neoplasm of breast: Secondary | ICD-10-CM

## 2021-02-21 NOTE — Patient Instructions (Addendum)
Thank you for coming to the office today.  Colon Cancer Screening: - For all adults age 48+ routine colon cancer screening is highly recommended.     - Recent guidelines from American [employer] Society recommend starting age of 76 - Early detection of colon cancer is important, because often there are no warning signs or symptoms, also if found early usually it can be cured. Late stage is hard to treat.  - If you are not interested in Colonoscopy screening (if done and normal you could be cleared for 5 to 10 years until next due), then Cologuard is an excellent alternative for screening test for Colon Cancer. It is highly sensitive for detecting DNA of colon cancer from even the earliest stages. Also, there is NO bowel prep required. - If Cologuard is NEGATIVE, then it is good for 3 years before next due - If Cologuard is POSITIVE, then it is strongly advised to get a Colonoscopy, which allows the GI doctor to locate the source of the cancer or polyp (even very early stage) and treat it by removing it. -------------------------  Follow instructions to collect sample, you may call the company for any help or questions, 24/7 telephone support at [phone].  ---  For Mammogram screening for breast cancer   Call the Imaging Center below anytime to schedule your own appointment now that order has been placed.  [facility] [address] [place], [place] [address] Phone: [phone]  --------------  DUE for FASTING BLOOD WORK (no food or drink after midnight before the lab appointment, only water or coffee without cream/sugar on the morning of)  [facility]  In 1 YEAR   Please schedule a Follow-up Appointment to: Return in about 1 year (around 02/21/2022) for 1 year Annual Physical (notify 1 week prior for [facility] orders).  If you have any other questions or concerns, please feel free to call the office or send a message through  MyChart. You may also schedule an earlier appointment if necessary.  Additionally, you may be receiving a survey about your experience at our office within a few days to 1 week by e-mail or mail. We value your feedback.  Saralyn Pilar, DO [facility], [place]

## 2021-02-21 NOTE — Progress Notes (Signed)
Subjective:    Patient ID: Marcia Simmons, female    DOB: 03-Apr-1971, 50 y.o.   MRN: [id number]  Marcia Simmons is a 50 y.o. female presenting on 02/21/2021 for Annual Exam, Depression, Anxiety, and restless leg   HPI  Here for Annual Physical and Lab REview.  Major Depression, chronic recurrent moderate / Anxiety / Insomnia Obesity BMI >38 working diet low carb options. - Currently does not want to take other extra medication. - Taking Bupropion XL 35m daily - usually taking in PM. - Admits some insomnia, chronic problem. Anxiety improved, reduced panic attacks - Has tried Lexapro, Prozac, Effexor, Zoloft, Trazodone (couldn't function next day after tried Trazodone), Buspar, Melatonin, Sertraline - Denies suicidal or homicidal  Hyperlipidemia Elevated Triglycerides 176, otherwise normal lipids. Admits was not fasting, ate fries before lab.  Lymphedema / Chronic Venous insufficiency Chronic problem Uses compression stockings. Has history trial on lymphedema pump, not seen vascular recently   Health Maintenance:  She will schedule upcoming pap smear apt. Next q 3 yr due in 2022.  Interested in CDelcoscreening today will place order   [facility] 02/21[facility]2021 01/10/2021 12/18/2020  Decreased Interest 0 0 0  Down, Depressed, Hopeless 0 1 0  PHQ - 2 Score 0 1 0  Altered sleeping _0 Tired, decreased energy 1 3 0  Change in appetite _1 Feeling bad or failure about yourself  0 0 1  Trouble concentrating 0 0 0  Moving slowly or fidgety/restless 0 0 0  Suicidal thoughts 0 0 0  PHQ-9 Score _2 Difficult doing work/chores Not difficult at all Somewhat difficult Not difficult at all  Some recent data might be hidden   GAD 7 : Generalized Anxiety Score 02/21/2021 01/10/2021 07/02/2020 03/14/2019  Nervous, Anxious, on Edge 0 _3 Control/stop worrying 0 _4 Worry too much - different things 0 _5 Trouble relaxing _6 Restless 0 0 1 2   Easily annoyed or irritable 0 _7 Afraid - awful might happen 0 _8 Total GAD 7 Score _9 Anxiety Difficulty Not difficult at all Somewhat difficult Not difficult at all Somewhat difficult     Past Medical History:  Diagnosis Date   [place]    Gravida 1 para 1    Migraines    Past Surgical History:  Procedure Laterality Date   APPENDECTOMY     CHOLECYSTECTOMY     Social History   Socioeconomic History   Marital status: Divorced    Spouse name: Not on file   Number of children: Not on file   Years of education: Not on file   Highest education level: Not on file  Occupational History   Not on file  Tobacco Use   Smoking status: Never   Smokeless tobacco: Never  Substance and Sexual Activity   Alcohol use: Yes    Alcohol/week: 0.0 standard drinks   Drug use: No   Sexual activity: Yes  Other Topics Concern   Not on file  Social History Narrative   Not on file   Social Determinants of Health   Financial Resource Strain: Not on file  Food Insecurity: Not on file  Transportation Needs: Not on file  Physical Activity: Not on file  Stress: Not on file  Social Connections: Not on file  Intimate Partner  Violence: Not on file   Family History  Problem Relation Age of Onset   Cancer Mother        cervical   Cancer Father        prostate   Heart disease Father    Breast cancer Paternal Aunt 67   Current Outpatient Medications on File Prior to Visit  Medication Sig   buPROPion (WELLBUTRIN XL) 300 MG 24 hr tablet Take 1 tablet (300 mg total) by mouth daily.   furosemide (LASIX) 20 MG tablet Take 1-2 tablets (20-40 mg total) by mouth daily as needed for edema. Take for up to 3-5 days at a time if needed, can increase dose.   omeprazole (PRILOSEC) 20 MG capsule Take 1 capsule (20 mg total) by mouth daily before breakfast.   phentermine (ADIPEX-P) 37.5 MG tablet Take 37.5 mg by mouth daily.   meloxicam (MOBIC) 15 MG tablet Take  1 tablet by mouth 3 (three) times daily. (Patient not taking: Reported on 02/21/2021)   No current facility-administered medications on file prior to visit.    Review of Systems  Constitutional:  Negative for activity change, appetite change, chills, diaphoresis, fatigue and fever.  HENT:  Negative for congestion and hearing loss.   Eyes:  Negative for visual disturbance.  Respiratory:  Negative for cough, chest tightness, shortness of breath and wheezing.   Cardiovascular:  Negative for chest pain, palpitations and leg swelling.  Gastrointestinal:  Negative for abdominal pain, constipation, diarrhea, nausea and vomiting.  Genitourinary:  Negative for dysuria, frequency and hematuria.  Musculoskeletal:  Negative for arthralgias and neck pain.  Skin:  Negative for rash.  Neurological:  Negative for dizziness, weakness, light-headedness, numbness and headaches.  Hematological:  Negative for adenopathy.  Psychiatric/Behavioral:  Negative for behavioral problems, dysphoric mood and sleep disturbance.   Per HPI unless specifically indicated above      Objective:    BP 116/75   Pulse 85   Ht _0  (1.651 m)   Wt 236 lb 12.8 oz (107.4 kg)   SpO2 100%   BMI 39.41 kg/m   Wt Readings from Last 3 Encounters:  02/21/21 236 lb 12.8 oz (107.4 kg)  01/10/21 239 lb 6.4 oz (108.6 kg)  07/02/20 231 lb (104.8 kg)    Physical Exam Vitals and nursing note reviewed.  Constitutional:      General: She is not in acute distress.    Appearance: She is well-developed. She is not diaphoretic.     Comments: Well-appearing, comfortable, cooperative  HENT:     Head: Normocephalic and atraumatic.  Eyes:     General:        Right eye: No discharge.        Left eye: No discharge.     Conjunctiva/sclera: Conjunctivae normal.     Pupils: Pupils are equal, round, and reactive to light.  Neck:     Thyroid: No thyromegaly.  Cardiovascular:     Rate and Rhythm: Normal rate and regular rhythm.      Pulses: Normal pulses.     Heart sounds: Normal heart sounds. No murmur heard. Pulmonary:     Effort: Pulmonary effort is normal. No respiratory distress.     Breath sounds: Normal breath sounds. No wheezing or rales.  Abdominal:     General: Bowel sounds are normal. There is no distension.     Palpations: Abdomen is soft. There is no mass.     Tenderness: There is no abdominal tenderness.  Musculoskeletal:  General: No tenderness. Normal range of motion.     Cervical back: Normal range of motion and neck supple.     Right lower leg: Edema present.     Left lower leg: Edema present.     Comments: Upper / Lower Extremities: - Normal muscle tone, strength bilateral upper extremities 5/5, lower extremities 5/5  Lymphadenopathy:     Cervical: No cervical adenopathy.  Skin:    General: Skin is warm and dry.     Findings: No erythema or rash.  Neurological:     Mental Status: She is alert and oriented to person, place, and time.     Comments: Distal sensation intact to light touch all extremities  Psychiatric:        Mood and Affect: Mood normal.        Behavior: Behavior normal.        Thought Content: Thought content normal.     Comments: Well groomed, good eye contact, normal speech and thoughts      Results for orders placed or performed in visit on 01/10/21  Hemoglobin A1c  Result Value Ref Range   Hgb A1c MFr Bld 5.4 4.8 - 5.6 %   Est. average glucose Bld gHb Est-mCnc 108 mg/dL  CBC with Differential/Platelet  Result Value Ref Range   WBC 7.7 3.4 - 10.8 x10E3/uL   RBC 4.88 3.77 - 5.28 x10E6/uL   Hemoglobin 11.7 11.1 - 15.9 g/dL   Hematocrit 38.8 34.0 - 46.6 %   MCV 80 79 - 97 fL   MCH 24.0 (L) 26.6 - 33.0 pg   MCHC 30.2 (L) 31.5 - 35.7 g/dL   RDW 15.5 (H) 11.7 - 15.4 %   Platelets 384 150 - 450 x10E3/uL   Neutrophils 57 Not Estab. %   Lymphs 32 Not Estab. %   Monocytes 9 Not Estab. %   Eos 2 Not Estab. %   Basos 0 Not Estab. %   Neutrophils Absolute 4.4 1.4  - 7.0 x10E3/uL   Lymphocytes Absolute 2.4 0.7 - 3.1 x10E3/uL   Monocytes Absolute 0.7 0.1 - 0.9 x10E3/uL   EOS (ABSOLUTE) 0.1 0.0 - 0.4 x10E3/uL   Basophils Absolute 0.0 0.0 - 0.2 x10E3/uL   Immature Granulocytes 0 Not Estab. %   Immature Grans (Abs) 0.0 0.0 - 0.1 x10E3/uL  Lipid panel  Result Value Ref Range   Cholesterol, Total 153 100 - 199 mg/dL   Triglycerides 176 (H) 0 - 149 mg/dL   HDL 56 >39 mg/dL   VLDL Cholesterol Cal 29 5 - 40 mg/dL   LDL Chol Calc (NIH) 68 0 - 99 mg/dL   Chol/HDL Ratio 2.7 0.0 - 4.4 ratio  Comprehensive metabolic panel  Result Value Ref Range   Glucose 93 65 - 99 mg/dL   BUN 12 6 - 24 mg/dL   Creatinine, Ser 0.78 0.57 - 1.00 mg/dL   eGFR 94 >59 mL/min/1.73   BUN/Creatinine Ratio 15 9 - 23   Sodium 142 134 - 144 mmol/L   Potassium 4.1 3.5 - 5.2 mmol/L   Chloride 105 96 - 106 mmol/L   CO2 21 20 - 29 mmol/L   Calcium 8.9 8.7 - 10.2 mg/dL   Total Protein 6.1 6.0 - 8.5 g/dL   Albumin 4.5 3.8 - 4.8 g/dL   Globulin, Total 1.6 1.5 - 4.5 g/dL   Albumin/Globulin Ratio 2.8 (H) 1.2 - 2.2   Bilirubin Total 0.2 0.0 - 1.2 mg/dL   Alkaline Phosphatase 103 44 - 121 IU/L  AST 7 0 - 40 IU/L   ALT 10 0 - 32 IU/L  TSH  Result Value Ref Range   TSH 1.970 0.450 - 4.500 uIU/mL  T4, free  Result Value Ref Range   Free T4 1.27 0.82 - 1.77 ng/dL  Brain natriuretic peptide  Result Value Ref Range   BNP 29.6 0.0 - 100.0 pg/mL  Hepatitis C antibody  Result Value Ref Range   Hep C Virus Ab <0.1 0.0 - 0.9 s/co ratio      Assessment & Plan:   Problem List Items Addressed This Visit     Obesity (BMI 30.0-34.9)   Major depressive disorder, recurrent, moderate (HCC)    Improved mood and anxiety Still chronic problem > 25 yrs Comorbid anxiety, insomnia Improved PHQ. Elevated GAD - Failed: Lexapro, Prozac, Effexor, Zoloft, Trazodone, Buspirone - No prior dx / Psych / counseling  Plan: F/u Psychiatry  - Continue Wellbutrin XL 380m daily, refill today - she was  advised to take in AM or afternoon, avoid taking late can worsen insomnia.       Other Visit Diagnoses     Annual physical exam    -  Primary   Colon cancer screening       Relevant Orders   Cologuard   Encounter for screening mammogram for malignant neoplasm of breast       Relevant Orders   MM 3D SCREEN BREAST BILATERAL      Updated Health Maintenance information  Due for routine colon cancer screening. Never had colonoscopy (not interested), no family history colon cancer. - Discussion today about recommendations for either Colonoscopy or Cologuard screening, benefits and risks of screening, interested in Cologuard, understands that if positive then recommendation is for diagnostic colonoscopy to follow-up. - Ordered Cologuard today  Reviewed recent lab results with patient Encouraged improvement to lifestyle with diet and exercise - Goal of weight loss    No orders of the defined types were placed in this encounter.  Orders Placed This Encounter  Procedures   MM 3D SCREEN BREAST BILATERAL    Standing Status:   Future    Standing Expiration Date:   02/21/2022    Order Specific Question:   Reason for Exam (SYMPTOM  OR DIAGNOSIS REQUIRED)    Answer:   Screening bilateral 3D Mammogram [place]    Order Specific Question:   [place]    [place]     Follow up plan: Return in about 1 year (around 02/21/2022) for 1 year Annual Physical (notify 1 week prior for [facility] orders).   ANobie Putnam [facility] 02/21/2021, 3:12 PM

## 2021-02-22 NOTE — Assessment & Plan Note (Signed)
Improved mood and anxiety Still chronic problem > 25 yrs Comorbid anxiety, insomnia Improved PHQ. Elevated GAD - Failed: Lexapro, Prozac, Effexor, Zoloft, Trazodone, Buspirone - No prior dx / Psych / counseling  Plan: F/u Psychiatry  - Continue Wellbutrin XL 300mg  daily, refill today - Marcia Simmons was advised to take in AM or afternoon, avoid taking late can worsen insomnia.

## 2021-07-18 ENCOUNTER — Other Ambulatory Visit: Payer: Self-pay

## 2021-07-18 ENCOUNTER — Ambulatory Visit
Admission: RE | Admit: 2021-07-18 | Discharge: 2021-07-18 | Disposition: A | Discharge status: Home / Self Care | Payer: Managed Care, Other (non HMO) | Source: Ambulatory Visit | Attending: Family Medicine | Admitting: Family Medicine

## 2021-07-18 DIAGNOSIS — Z1231 Encounter for screening mammogram for malignant neoplasm of breast: Secondary | ICD-10-CM | POA: Insufficient documentation

## 2021-10-21 ENCOUNTER — Other Ambulatory Visit: Payer: Self-pay | Admitting: Family Medicine

## 2021-10-21 DIAGNOSIS — K219 Gastro-esophageal reflux disease without esophagitis: Secondary | ICD-10-CM

## 2021-10-21 DIAGNOSIS — F331 Major depressive disorder, recurrent, moderate: Secondary | ICD-10-CM

## 2021-10-22 NOTE — Telephone Encounter (Signed)
Requested Prescriptions  Pending Prescriptions Disp Refills   omeprazole (PRILOSEC) 20 MG capsule [Pharmacy Med Name: Omeprazole 20 MG Oral Capsule Delayed Release] 90 capsule 0    Sig: TAKE 1 CAPSULE BY MOUTH  DAILY BEFORE BREAKFAST     Gastroenterology: Proton Pump Inhibitors Passed - 10/21/2021 11:53 PM      Passed - Valid encounter within last 12 months    Recent Outpatient Visits          8 months ago Annual physical exam   [facility] Olin Hauser, DO   9 months ago Bilateral lower extremity edema   [place], DO   10 months ago Major depressive disorder, recurrent, moderate ([place])   [facility] [place], Devonne Doughty, DO   1 year ago Major depressive disorder, recurrent, moderate ([place])   [facility] [place], Devonne Doughty, DO   2 years ago Right leg swelling   [place], DO              buPROPion (WELLBUTRIN XL) 300 MG 24 hr tablet [Pharmacy Med Name: buPROPion HCl ER (XL) 300 MG Oral Tablet Extended Release 24 Hour] 90 tablet 0    Sig: TAKE 1 TABLET BY MOUTH  DAILY     Psychiatry: Antidepressants - bupropion Failed - 10/21/2021 11:53 PM      Failed - Valid encounter within last 6 months    Recent Outpatient Visits          8 months ago Annual physical exam   [facility] Olin Hauser, DO   9 months ago Bilateral lower extremity edema   [place], DO   10 months ago Major depressive disorder, recurrent, moderate ([place])   [facility] [place], Devonne Doughty, DO   1 year ago Major depressive disorder, recurrent, moderate ([place])   [facility] [place], Devonne Doughty, DO   2 years ago Right leg swelling   [place], DO             Passed - Cr in normal range and within 360  days    Creatinine, Ser  Date Value Ref Range Status  02/17/2021 0.78 0.57 - 1.00 mg/dL Final         Passed - AST in normal range and within 360 days    AST  Date Value Ref Range Status  02/17/2021 7 0 - 40 IU/L Final         Passed - ALT in normal range and within 360 days    ALT  Date Value Ref Range Status  02/17/2021 10 0 - 32 IU/L Final         Passed - Completed PHQ-2 or PHQ-9 in the last 360 days      Passed - Last BP in normal range    BP Readings from Last 1 Encounters:  02/21/21 116/75

## 2022-01-14 ENCOUNTER — Other Ambulatory Visit: Payer: Self-pay | Admitting: Family Medicine

## 2022-01-14 DIAGNOSIS — K219 Gastro-esophageal reflux disease without esophagitis: Secondary | ICD-10-CM

## 2022-01-15 ENCOUNTER — Other Ambulatory Visit: Payer: Self-pay | Admitting: Family Medicine

## 2022-01-15 DIAGNOSIS — F331 Major depressive disorder, recurrent, moderate: Secondary | ICD-10-CM

## 2022-01-15 NOTE — Telephone Encounter (Signed)
Requested medication (s) are due for refill today: yes ? ?Requested medication (s) are on the active medication list: yes ? ?Last refill:  10/22/21 ? ?Future visit scheduled: no ? ?Notes to clinic:  Unable to refill per protocol, appointment needed.  ? ? ?  ?Requested Prescriptions  ?Pending Prescriptions Disp Refills  ? buPROPion (WELLBUTRIN XL) 300 MG 24 hr tablet [Pharmacy Med Name: buPROPion HCl ER (XL) 300 MG Oral Tablet Extended Release 24 Hour] 90 tablet 3  ?  Sig: TAKE 1 TABLET BY MOUTH  DAILY  ?  ? Psychiatry: Antidepressants - bupropion Failed - 01/15/2022  5:20 AM  ?  ?  Failed - Valid encounter within last 6 months  ?  Recent Outpatient Visits   ? ?      ? 10 months ago Annual physical exam  ? [place], DO  ? 1 year ago Bilateral lower extremity edema  ? [facility] [place], Devonne Doughty, DO  ? 1 year ago Major depressive disorder, recurrent, moderate ([place])  ? [facility] [place], Devonne Doughty, DO  ? 1 year ago Major depressive disorder, recurrent, moderate ([place])  ? [place], DO  ? 2 years ago Right leg swelling  ? [place], DO  ? ?  ?  ? ? ?  ?  ?  Passed - Cr in normal range and within 360 days  ?  Creatinine, Ser  ?Date Value Ref Range Status  ?02/17/2021 0.78 0.57 - 1.00 mg/dL Final  ?   ?  ?  Passed - AST in normal range and within 360 days  ?  AST  ?Date Value Ref Range Status  ?02/17/2021 7 0 - 40 IU/L Final  ?   ?  ?  Passed - ALT in normal range and within 360 days  ?  ALT  ?Date Value Ref Range Status  ?02/17/2021 10 0 - 32 IU/L Final  ?   ?  ?  Passed - Completed PHQ-2 or PHQ-9 in the last 360 days  ?  ?  Passed - Last BP in normal range  ?  BP Readings from Last 1 Encounters:  ?02/21/21 116/75  ?   ?  ?  ? ? ?

## 2022-01-15 NOTE — Telephone Encounter (Signed)
Requested Prescriptions  ?Pending Prescriptions Disp Refills  ?? omeprazole (PRILOSEC) 20 MG capsule [Pharmacy Med Name: Omeprazole 20 MG Oral Capsule Delayed Release] 90 capsule 1  ?  Sig: TAKE 1 CAPSULE BY MOUTH DAILY  BEFORE BREAKFAST  ?  ? Gastroenterology: Proton Pump Inhibitors Passed - 01/14/2022 11:44 PM  ?  ?  Passed - Valid encounter within last 12 months  ?  Recent Outpatient Visits   ?      ? 10 months ago Annual physical exam  ? [place], DO  ? 1 year ago Bilateral lower extremity edema  ? [facility] [place], Devonne Doughty, DO  ? 1 year ago Major depressive disorder, recurrent, moderate ([place])  ? [facility] [place], Devonne Doughty, DO  ? 1 year ago Major depressive disorder, recurrent, moderate ([place])  ? [place], DO  ? 2 years ago Right leg swelling  ? [place], DO  ?  ?  ? ?  ?  ?  ? ? ?

## 2022-01-29 LAB — COLOGUARD
COLOGUARD: NEGATIVE
Cologuard: NEGATIVE

## 2022-02-17 ENCOUNTER — Ambulatory Visit: Payer: Managed Care, Other (non HMO) | Admitting: Family Medicine

## 2022-02-17 ENCOUNTER — Encounter: Payer: Self-pay | Admitting: Family Medicine

## 2022-02-17 ENCOUNTER — Other Ambulatory Visit: Payer: Self-pay | Admitting: Family Medicine

## 2022-02-17 VITALS — BP 123/78 | HR 87 | Ht 65.0 in | Wt 232.6 lb

## 2022-02-17 DIAGNOSIS — E669 Obesity, unspecified: Secondary | ICD-10-CM

## 2022-02-17 DIAGNOSIS — R7309 Other abnormal glucose: Secondary | ICD-10-CM

## 2022-02-17 DIAGNOSIS — Z0000 Encounter for general adult medical examination without abnormal findings: Secondary | ICD-10-CM

## 2022-02-17 DIAGNOSIS — R002 Palpitations: Secondary | ICD-10-CM

## 2022-02-17 DIAGNOSIS — R0789 Other chest pain: Secondary | ICD-10-CM | POA: Diagnosis not present

## 2022-02-17 DIAGNOSIS — R000 Tachycardia, unspecified: Secondary | ICD-10-CM

## 2022-02-17 DIAGNOSIS — E538 Deficiency of other specified B group vitamins: Secondary | ICD-10-CM

## 2022-02-17 DIAGNOSIS — E559 Vitamin D deficiency, unspecified: Secondary | ICD-10-CM

## 2022-02-17 NOTE — Patient Instructions (Addendum)
Thank you for coming to the office today.  Stop the Phentermine, I believe this could be causing or contributing to most of your symptoms described today  In the future if still issue with shortness of breath, we can pursue heart and or lungs with further evaluation, to be determined.   Please schedule a Follow-up Appointment to: Return in about 6 weeks (around 03/31/2022) for 6 weeks Annual Physical, fasting labs [facility] prior.  If you have any other questions or concerns, please feel free to call the office or send a message through [place]. You may also schedule an earlier appointment if necessary.  Additionally, you may be receiving a survey about your experience at our office within a few days to 1 week by e-mail or mail. We value your feedback.  Nobie Putnam, DO [place]

## 2022-02-17 NOTE — Progress Notes (Signed)
Subjective:    Patient ID: Marcia Simmons, female    DOB: 1971/04/23, 51 y.o.   MRN: [id number]  Marcia Simmons is a 51 y.o. female presenting on 02/17/2022 for Anxiety   HPI  Atypical Chest Pain vs Panic Attack Medication side effect - Phentermine  She was at work recently. She has not had this before. She felt sudden episode while standing up with chest pain and went to both arms right and left and seemed to go back up to her head and she felt head numbness tingling not anywhere else, then the chest pain went away and arms resolved and her heart started to race and sat and rested and symptoms resolved within a few minutes. She does not admit any physical or mental stress. She says that it did not feel like she was stressed or worried. But she admits stressful job and could have been from working a lot.  She has no other heart history. 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  She admits increased shortness of breath with exertion and also has spontaneous heart racing.  History of COVID 1.5 years ago. Seems gradual worsening shortness of breath.  She is on Phentermine, restarted this recently for past few months.      02/17/2022    3:40 PM 02/21/2021    3:04 PM 01/10/2021   10:45 AM  Depression screen PHQ 2/9  Decreased Interest 1 0 0  Down, Depressed, Hopeless 1 0 1  PHQ - 2 Score 2 0 1  Altered sleeping 2 2 2   Tired, decreased energy 1 1 3   Change in appetite 2 1 2   Feeling bad or failure about yourself  1 0 0  Trouble concentrating 1 0 0  Moving slowly or fidgety/restless 0 0 0  Suicidal thoughts 0 0 0  PHQ-9 Score 9 4 8   Difficult doing work/chores Not difficult at all Not difficult at all Somewhat difficult      02/17/2022    3:41 PM 02/21/2021    3:05 PM 01/10/2021   10:46 AM 07/02/2020    2:04 PM  GAD 7 : Generalized Anxiety Score  Nervous, Anxious, on Edge 1 0 1 2  Control/stop worrying 1 0 1 3  Worry too much - different things 1 0 1 3  Trouble relaxing 1 1 1  2   Restless 1 0 0 1  Easily annoyed or irritable 1 0 1 2  Afraid - awful might happen 0 0 1 2  Total GAD 7 Score 6 1 6 15   Anxiety Difficulty Not difficult at all Not difficult at all Somewhat difficult Not difficult at all      Social History   Tobacco Use   Smoking status: Never   Smokeless tobacco: Never  Substance Use Topics   Alcohol use: Yes    Alcohol/week: 0.0 standard drinks of alcohol   Drug use: No    Review of Systems Per HPI unless specifically indicated above     Objective:    BP 123/78   Pulse 87   Ht 5\' 5"  (1.651 m)   Wt 232 lb 9.6 oz (105.5 kg)   SpO2 100%   BMI 38.71 kg/m   Wt Readings from Last 3 Encounters:  02/17/22 232 lb 9.6 oz (105.5 kg)  02/21/21 236 lb 12.8 oz (107.4 kg)  01/10/21 239 lb 6.4 oz (108.6 kg)    Physical Exam Vitals and nursing note reviewed.  Constitutional:      General:  She is not in acute distress.    Appearance: She is well-developed. She is not diaphoretic.     Comments: Well-appearing, comfortable, cooperative  HENT:     Head: Normocephalic and atraumatic.  Eyes:     General:        Right eye: No discharge.        Left eye: No discharge.     Conjunctiva/sclera: Conjunctivae normal.  Neck:     Thyroid: No thyromegaly.  Cardiovascular:     Rate and Rhythm: Normal rate and regular rhythm.     Heart sounds: Normal heart sounds. No murmur heard. Pulmonary:     Effort: Pulmonary effort is normal. No respiratory distress.     Breath sounds: Normal breath sounds. No wheezing or rales.  Musculoskeletal:        General: Normal range of motion.     Cervical back: Normal range of motion and neck supple.  Lymphadenopathy:     Cervical: No cervical adenopathy.  Skin:    General: Skin is warm and dry.     Findings: No erythema or rash.  Neurological:     Mental Status: She is alert and oriented to person, place, and time.  Psychiatric:        Behavior: Behavior normal.     Comments: Well groomed, good eye contact,  normal speech and thoughts       Results for orders placed or performed in visit on 02/05/22  Cologuard  Result Value Ref Range   Cologuard Negative Negative      Assessment & Plan:   Problem List Items Addressed This Visit   None Visit Diagnoses     Intermittent palpitations    -  Primary   Rapid heart rate       Atypical chest pain           Episodic symptoms concerning for panic vs stress on body Ultimately believe Phentermine side effect is causing her symptoms.  Discontinue Phentermine Monitor symptoms  In the future if still issue with shortness of breath, we can pursue heart and or lungs with further evaluation, to be determined.  Return criteria given if not improving.  No orders of the defined types were placed in this encounter.  Fasting orders for [facility] printed for patient    Follow up plan: Return in about 6 weeks (around 03/31/2022) for 6 weeks Annual Physical, fasting labs [facility] prior.   Saralyn Pilar, DO [facility] 02/17/2022, 3:59 PM

## 2022-04-07 ENCOUNTER — Encounter: Payer: Managed Care, Other (non HMO) | Admitting: Family Medicine

## 2022-07-01 ENCOUNTER — Other Ambulatory Visit: Payer: Self-pay | Admitting: Family Medicine

## 2022-07-01 DIAGNOSIS — K219 Gastro-esophageal reflux disease without esophagitis: Secondary | ICD-10-CM

## 2022-07-02 NOTE — Telephone Encounter (Signed)
Requested Prescriptions  Pending Prescriptions Disp Refills  . omeprazole (PRILOSEC) 20 MG capsule [Pharmacy Med Name: Omeprazole 20 MG Oral Capsule Delayed Release] 90 capsule 1    Sig: TAKE 1 CAPSULE BY MOUTH DAILY  BEFORE BREAKFAST     Gastroenterology: Proton Pump Inhibitors Passed - 07/01/2022  1:29 PM      Passed - Valid encounter within last 12 months    Recent Outpatient Visits          4 months ago Intermittent palpitations   [place], DO   1 year ago Annual physical exam   [facility] Olin Hauser, DO   1 year ago Bilateral lower extremity edema   [place], DO   1 year ago Major depressive disorder, recurrent, moderate ([place])   [place], DO   2 years ago Major depressive disorder, recurrent, moderate ([place])   [place], Devonne Doughty, DO

## 2022-07-06 ENCOUNTER — Encounter (INDEPENDENT_AMBULATORY_CARE_PROVIDER_SITE_OTHER): Payer: Self-pay

## 2022-08-01 IMAGING — MG MM DIGITAL SCREENING BILAT W/ TOMO AND CAD
6 of 10 series · 6 of 30 positions shown · non-contrast
Comparison: Previous exam(s).

CLINICAL DATA: Screening.

EXAM:
DIGITAL SCREENING BILATERAL MAMMOGRAM WITH TOMOSYNTHESIS AND CAD
TECHNIQUE: Bilateral screening digital craniocaudal and mediolateral oblique
mammograms were obtained. Bilateral screening digital breast
tomosynthesis was performed. The images were evaluated with
computer-aided detection.

[L CC synth-2D]
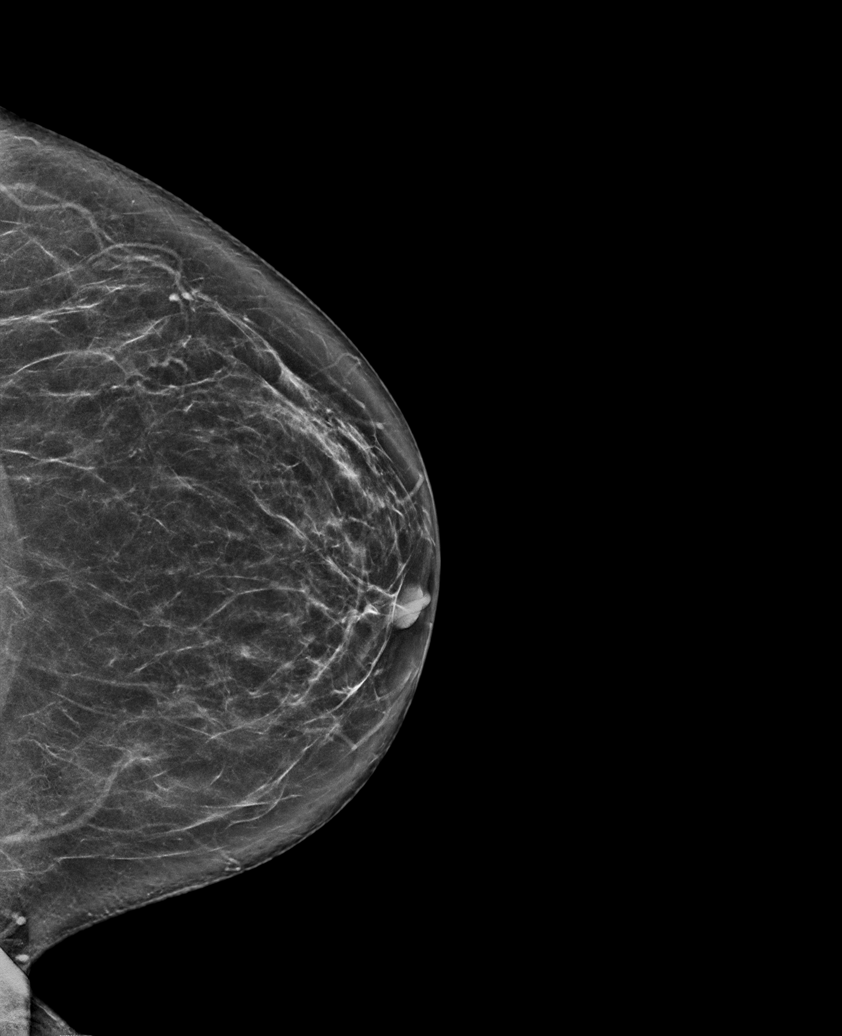

[L MLO synth-2D (1 of 2)]
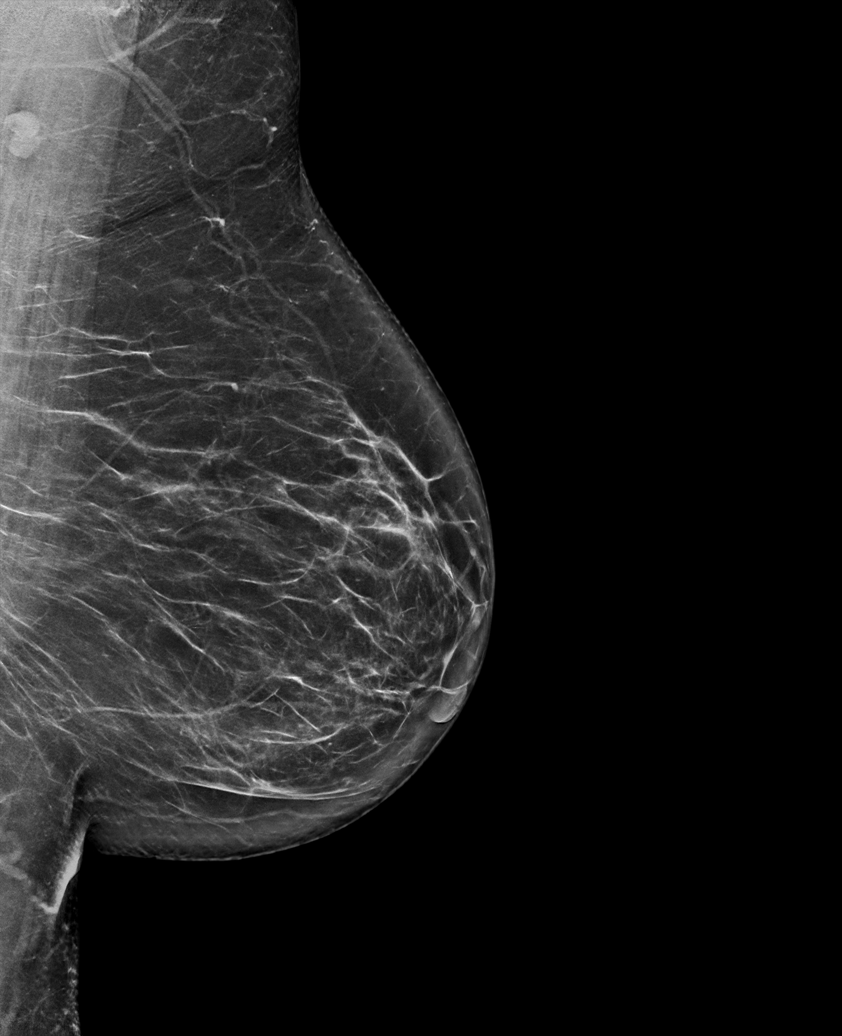

[L MLO synth-2D (2 of 2)]
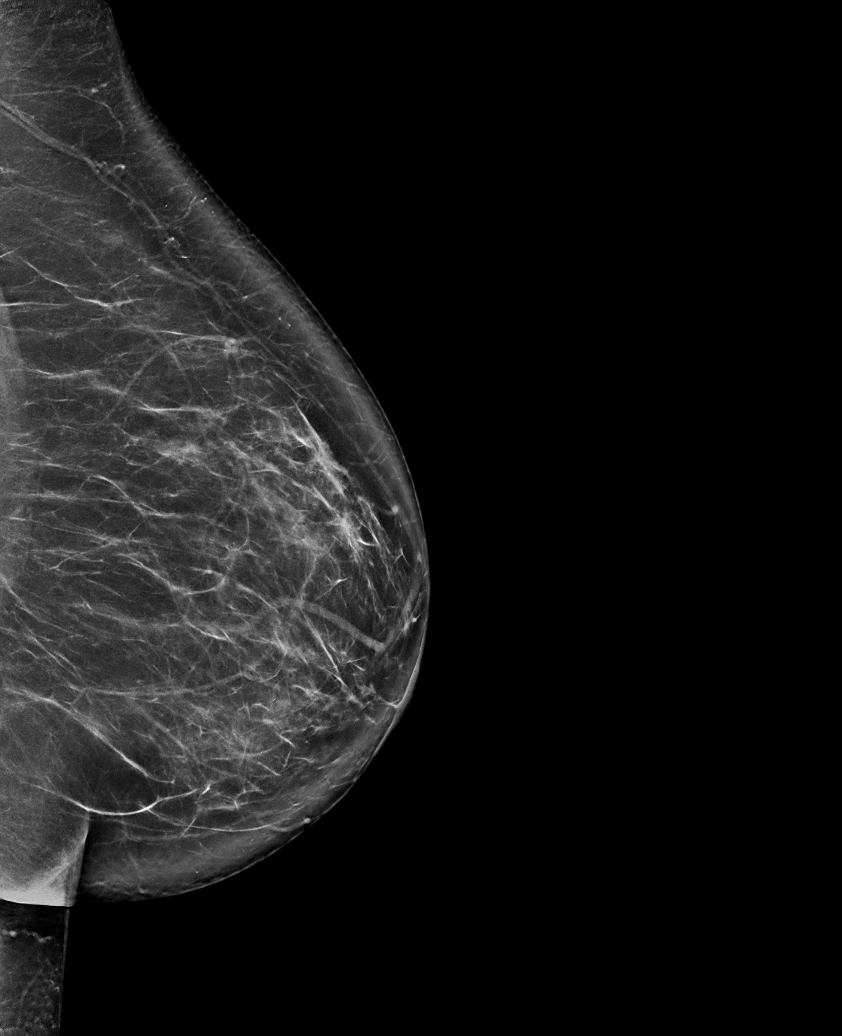

[R MLO synth-2D]
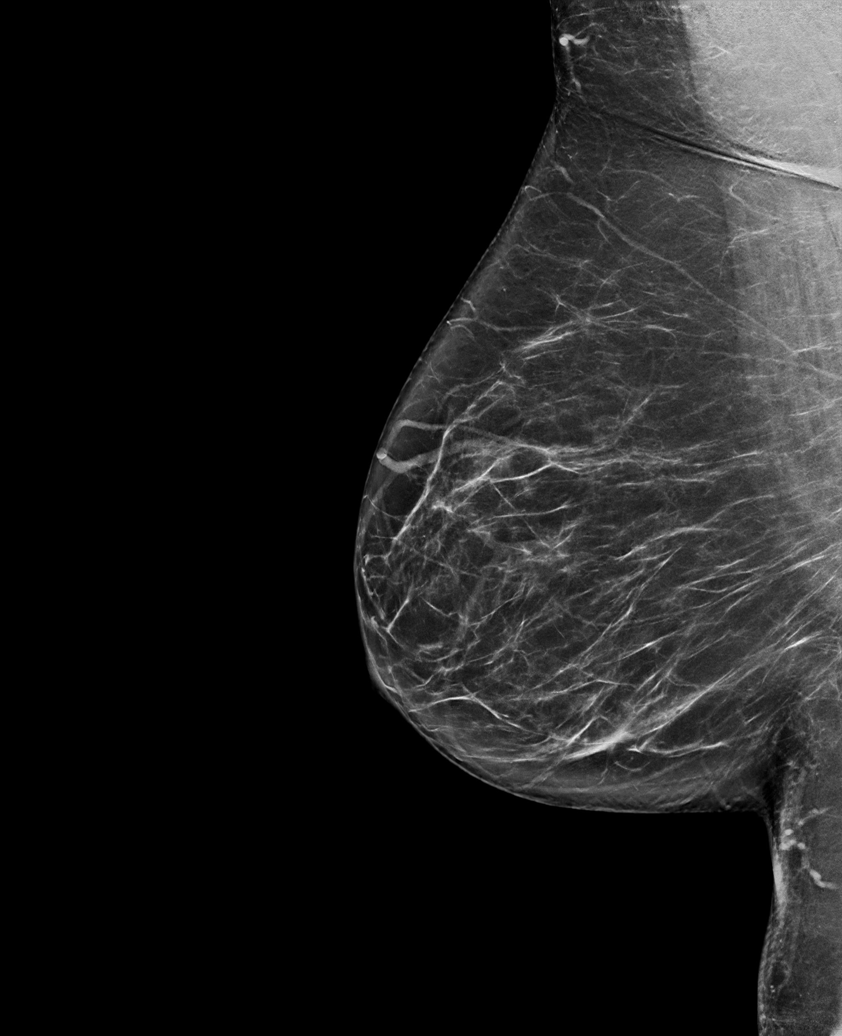

[R CC synth-2D]
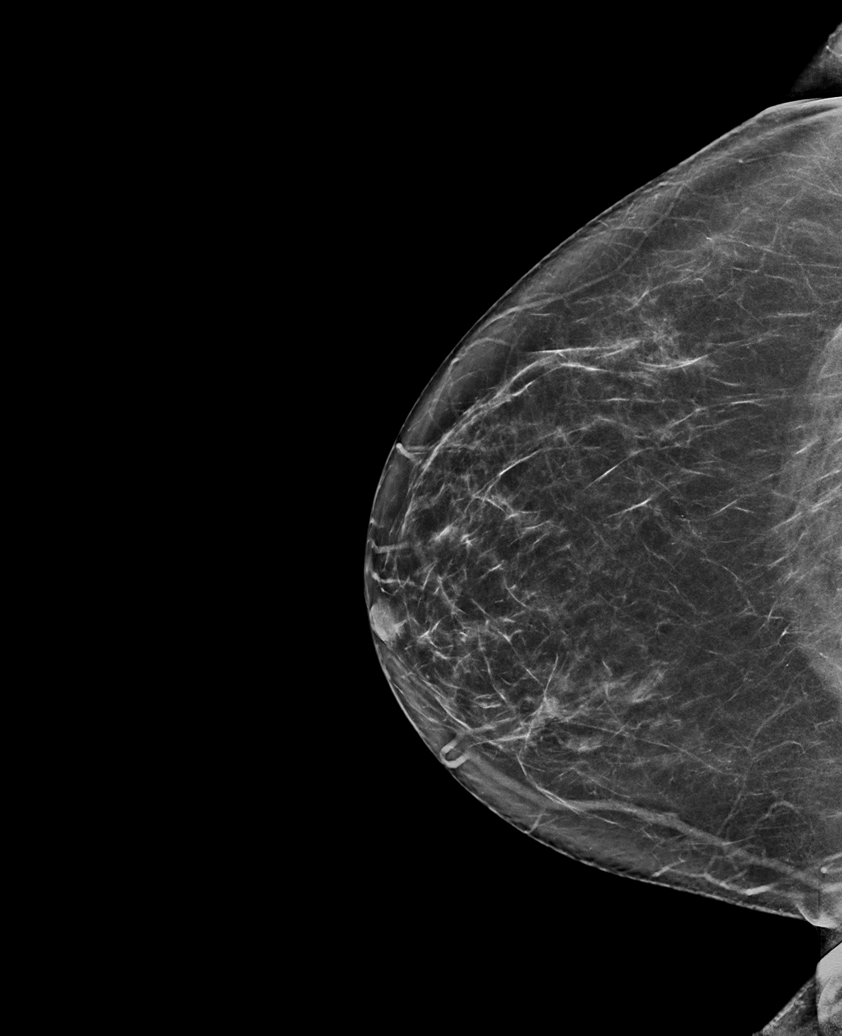

[L MLO tomo · tomo slice 44/87.0]
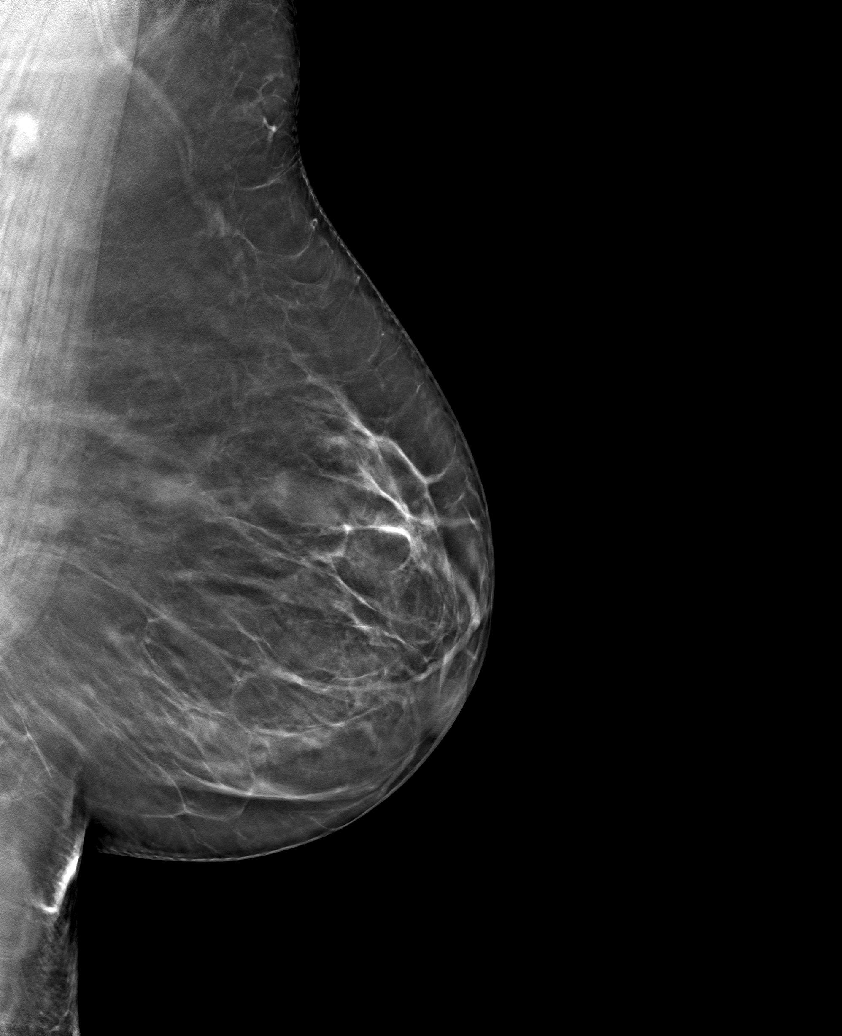

[6 of 30 positions shown; findings below may reference images not displayed]

ACR Breast Density Category b: There are scattered areas of
fibroglandular density.
FINDINGS: There are no findings suspicious for malignancy.
IMPRESSION: No mammographic evidence of malignancy. A result letter of this
screening mammogram will be mailed directly to the patient.

RECOMMENDATION:
Screening mammogram in one year. (Code:51-O-LD2)

BI-RADS CATEGORY  1: Negative.

## 2022-10-27 ENCOUNTER — Ambulatory Visit: Payer: Managed Care, Other (non HMO) | Admitting: Family Medicine

## 2022-10-27 ENCOUNTER — Encounter: Payer: Self-pay | Admitting: Family Medicine

## 2022-10-27 VITALS — BP 126/70 | HR 82 | Ht 65.0 in | Wt 236.8 lb

## 2022-10-27 DIAGNOSIS — G5711 Meralgia paresthetica, right lower limb: Secondary | ICD-10-CM

## 2022-10-27 DIAGNOSIS — M79604 Pain in right leg: Secondary | ICD-10-CM

## 2022-10-27 DIAGNOSIS — G5781 Other specified mononeuropathies of right lower limb: Secondary | ICD-10-CM

## 2022-10-27 MED ORDER — GABAPENTIN 100 MG PO CAPS: ORAL_CAPSULE | ORAL | 2 refills | Status: AC

## 2022-10-27 MED ORDER — TIZANIDINE HCL 4 MG PO TABS: 2.0000 mg | ORAL_TABLET | Freq: Three times a day (TID) | ORAL | 2 refills | Status: AC | PRN

## 2022-10-27 NOTE — Progress Notes (Signed)
Subjective:    Patient ID: Marcia Simmons, female    DOB: 1971/05/25, 52 y.o.   MRN: [id number]  Marcia Simmons is a 52 y.o. female presenting on 10/27/2022 for Leg Pain (Pt.states that are is pain all over but leg is the worst. )  Patient presents for a same day appointment.   HPI  Right Leg Pain, Thigh Pain New problem few months, without worsening but seems persistent. Right posterior thigh pain, not bad during the day, sharp mild aching pain can be 0 out of 10 sometimes with pain free episodes, but no pain free days. Can be mild during the day, unrelated to walking standing activity or exertion, but will flare up more severe pain at night. Can wake her up from sleep. - Worse if lays on R side with her sleep. - Taking Meloxicam 44m daily, no relief - Taking Tylenol AS NEEDED without any relief Admits some edema but not having erythema or warmth - Denies any numbness tingling weakness  History of DVT Left side Has seen Vascular years ago  Hot Flashes / Menopausal / Joint pains Not on medication  Bilateral joint pains hands, knees, hips.  History of carpal tunnel.  history of Left lower extremity some tingling, due to history of DVT maybe neuropathy      10/27/2022   11:45 AM 02/17/2022    3:40 PM 02/21/2021    3:04 PM  Depression screen PHQ 2/9  Decreased Interest 1 1 0  Down, Depressed, Hopeless 1 1 0  PHQ - 2 Score 2 2 0  Altered sleeping 3 2 2  $ Tired, decreased energy 3 1 1  $ Change in appetite 2 2 1  $ Feeling bad or failure about yourself  1 1 0  Trouble concentrating 1 1 0  Moving slowly or fidgety/restless 0 0 0  Suicidal thoughts 0 0 0  PHQ-9 Score 12 9 4  $ Difficult doing work/chores Not difficult at all Not difficult at all Not difficult at all    Social History   Tobacco Use   Smoking status: Never   Smokeless tobacco: Never  Substance Use Topics   Alcohol use: Yes    Alcohol/week: 0.0 standard drinks of alcohol   Drug use: No    Review of  Systems Per HPI unless specifically indicated above     Objective:    BP 126/70   Pulse 82   Ht 5' 5"$  (1.651 m)   Wt 236 lb 12.8 oz (107.4 kg)   SpO2 99%   BMI 39.41 kg/m   Wt Readings from Last 3 Encounters:  10/27/22 236 lb 12.8 oz (107.4 kg)  02/17/22 232 lb 9.6 oz (105.5 kg)  02/21/21 236 lb 12.8 oz (107.4 kg)    Physical Exam Vitals and nursing note reviewed.  Constitutional:      General: She is not in acute distress.    Appearance: Normal appearance. She is well-developed. She is obese. She is not diaphoretic.     Comments: Well-appearing, comfortable, cooperative  HENT:     Head: Normocephalic and atraumatic.  Eyes:     General:        Right eye: No discharge.        Left eye: No discharge.     Conjunctiva/sclera: Conjunctivae normal.  Cardiovascular:     Rate and Rhythm: Normal rate.  Pulmonary:     Effort: Pulmonary effort is normal.  Musculoskeletal:     Comments: Localized R posterior thigh pain, not reproduced on  exam, R knee is normal appearing. Full range of motion, has good range of motion R hip flex ext.  Skin:    General: Skin is warm and dry.     Findings: No erythema or rash.  Neurological:     Mental Status: She is alert and oriented to person, place, and time.  Psychiatric:        Mood and Affect: Mood normal.        Behavior: Behavior normal.        Thought Content: Thought content normal.     Comments: Well groomed, good eye contact, normal speech and thoughts    Results for orders placed or performed in visit on 02/05/22  Cologuard  Result Value Ref Range   Cologuard Negative Negative      Assessment & Plan:   Problem List Items Addressed This Visit   None Visit Diagnoses     Leg pain, posterior, right    -  Primary   Relevant Medications   gabapentin (NEURONTIN) 100 MG capsule   tiZANidine (ZANAFLEX) 4 MG tablet   Nerve entrapment of lower limb, right       Relevant Medications   QSYMIA 15-92 MG CP24   gabapentin  (NEURONTIN) 100 MG capsule   tiZANidine (ZANAFLEX) 4 MG tablet   Meralgia paraesthetica, right       Relevant Medications   QSYMIA 15-92 MG CP24   gabapentin (NEURONTIN) 100 MG capsule   tiZANidine (ZANAFLEX) 4 MG tablet       R sided posterior thigh bandlike pain Neuropathic in nature No other obvious etiology No other lower symptoms, not suggestive of blood clot, but has history. She is not endorsing back pain or radicular pain. Seems more localized. Worse at night. Not provoked MSK  Also perimenopausal hot flashes.  Could be nerve impingement entrapment  Start Gabapentin 139m capsules, take at night for 2-3 nights only, and then increase to 2 times a day for a few days, and then may increase to 3 times a day, it may make you drowsy, if helps significantly at night only, then you can increase instead to 3 capsules at night, instead of 3 times a day - In the future if needed, we can significantly increase the dose if tolerated well, some common doses are 3061mthree times a day up to 60028mhree times a day, usually it takes several weeks or months to get to higher doses  It can help with hot flashes as well.  Tizanidine is a muscle relaxant, can use as needed as well.  Technically all meds are in different categories, gabapentin, tizanidine, meloxcam, tylenol - can all work together.  Meds ordered this encounter  Medications   gabapentin (NEURONTIN) 100 MG capsule    Sig: Start 1 capsule daily, increase by 1 cap every 2-3 days as tolerated up to 3 times a day, or may take 3 at once in evening.    Dispense:  90 capsule    Refill:  2   tiZANidine (ZANAFLEX) 4 MG tablet    Sig: Take 0.5-1 tablets (2-4 mg total) by mouth every 8 (eight) hours as needed for muscle spasms.    Dispense:  30 tablet    Refill:  2     Follow up plan: Return if symptoms worsen or fail to improve.   AleNobie PutnamO [facility] 10/27/2022,  11:48 AM

## 2022-10-27 NOTE — Patient Instructions (Addendum)
Thank you for coming to the office today.  Try the following  Could be nerve impingement entrapment  Start Gabapentin 147m capsules, take at night for 2-3 nights only, and then increase to 2 times a day for a few days, and then may increase to 3 times a day, it may make you drowsy, if helps significantly at night only, then you can increase instead to 3 capsules at night, instead of 3 times a day - In the future if needed, we can significantly increase the dose if tolerated well, some common doses are 308mthree times a day up to 60074mhree times a day, usually it takes several weeks or months to get to higher doses  It can help with hot flashes as well.  Tizanidine is a muscle relaxant, can use as needed as well.  Technically all meds are in different categories, gabapentin, tizanidine, meloxcam, tylenol - can all work together.   Please schedule a Follow-up Appointment to: Return if symptoms worsen or fail to improve.  If you have any other questions or concerns, please feel free to call the office or send a message through [facility] may also schedule an earlier appointment if necessary.  Additionally, you may be receiving a survey about your experience at our office within a few days to 1 week by e-mail or mail. We value your feedback.  AleNobie PutnamO [place]

## 2022-11-23 ENCOUNTER — Other Ambulatory Visit: Payer: Self-pay | Admitting: Family Medicine

## 2022-11-23 DIAGNOSIS — K219 Gastro-esophageal reflux disease without esophagitis: Secondary | ICD-10-CM

## 2022-11-24 NOTE — Telephone Encounter (Signed)
Rx 07/03/23 #90 1RF- too soon Requested Prescriptions  Pending Prescriptions Disp Refills   omeprazole (PRILOSEC) 20 MG capsule [Pharmacy Med Name: Omeprazole 20 MG Oral Capsule Delayed Release] 90 capsule 3    Sig: TAKE 1 CAPSULE BY MOUTH DAILY  BEFORE BREAKFAST     Gastroenterology: Proton Pump Inhibitors Passed - 11/23/2022 10:18 PM      Passed - Valid encounter within last 12 months    Recent Outpatient Visits           4 weeks ago Leg pain, posterior, right   [place], DO   9 months ago Intermittent palpitations   [place], DO   1 year ago Annual physical exam   [facility] Olin Hauser, DO   1 year ago Bilateral lower extremity edema   [place], DO   1 year ago Major depressive disorder, recurrent, moderate [facility])   [place], Devonne Doughty, [place]

## 2022-12-17 ENCOUNTER — Other Ambulatory Visit: Payer: Self-pay | Admitting: Family Medicine

## 2022-12-17 DIAGNOSIS — F331 Major depressive disorder, recurrent, moderate: Secondary | ICD-10-CM

## 2022-12-18 NOTE — Telephone Encounter (Signed)
Called and left a voicemail to call and make an appt. For her yearly physical.  I gave her a 30 day courtesy supply of the Wellbutrin 300 mg

## 2023-01-14 ENCOUNTER — Other Ambulatory Visit: Payer: Self-pay | Admitting: Family Medicine

## 2023-01-14 DIAGNOSIS — F331 Major depressive disorder, recurrent, moderate: Secondary | ICD-10-CM

## 2023-01-14 NOTE — Telephone Encounter (Signed)
Requested medication (s) are due for refill today:   Yes  Requested medication (s) are on the active medication list:   Yes  Future visit scheduled:   No   Been notified to call in and schedule yearly physical without a response.   Last ordered: Courtesy supply was given on 12/18/2022 #30, 0 refills  Returned because labs are due and needs an OV.   No response to message to call for an appt.   Requested Prescriptions  Pending Prescriptions Disp Refills   buPROPion (WELLBUTRIN XL) 300 MG 24 hr tablet [Pharmacy Med Name: buPROPion HCl ER (XL) 300 MG Oral Tablet Extended Release 24 Hour] 30 tablet 11    Sig: TAKE 1 TABLET BY MOUTH DAILY     Psychiatry: Antidepressants - bupropion Failed - 01/14/2023  2:20 AM      Failed - Cr in normal range and within 360 days    Creatinine, Ser  Date Value Ref Range Status  02/17/2021 0.78 0.57 - 1.00 mg/dL Final         Failed - AST in normal range and within 360 days    AST  Date Value Ref Range Status  02/17/2021 7 0 - 40 IU/L Final         Failed - ALT in normal range and within 360 days    ALT  Date Value Ref Range Status  02/17/2021 10 0 - 32 IU/L Final         Failed - Valid encounter within last 6 months    Recent Outpatient Visits           2 months ago Leg pain, posterior, right   [place] [facility] Smitty Cords, DO   11 months ago Intermittent palpitations   [place] [facility] Smitty Cords, DO   1 year ago Annual physical exam   [place] [facility] Smitty Cords, DO   2 years ago Bilateral lower extremity edema   [place] [facility] [place], Netta Neat, DO   2 years ago Major depressive disorder, recurrent, moderate [facility])   [place] [facility] [place], Netta Neat, [place]              Passed - Completed PHQ-2 or PHQ-9 in the last 360 days      Passed - Last BP in normal range     BP Readings from Last 1 Encounters:  10/27/22 126/70

## 2023-01-21 ENCOUNTER — Other Ambulatory Visit: Payer: Self-pay | Admitting: Internal Medicine

## 2023-01-21 DIAGNOSIS — R0789 Other chest pain: Secondary | ICD-10-CM

## 2023-02-05 ENCOUNTER — Other Ambulatory Visit: Payer: Self-pay | Admitting: Family Medicine

## 2023-02-05 DIAGNOSIS — Z1231 Encounter for screening mammogram for malignant neoplasm of breast: Secondary | ICD-10-CM

## 2023-02-22 ENCOUNTER — Telehealth (HOSPITAL_COMMUNITY): Payer: Self-pay | Admitting: *Deleted

## 2023-02-22 ENCOUNTER — Encounter: Payer: Self-pay | Admitting: Radiology

## 2023-02-22 ENCOUNTER — Ambulatory Visit
Admission: RE | Admit: 2023-02-22 | Discharge: 2023-02-22 | Disposition: A | Discharge status: Home / Self Care | Payer: Managed Care, Other (non HMO) | Source: Ambulatory Visit | Attending: Family Medicine | Admitting: Family Medicine

## 2023-02-22 DIAGNOSIS — Z1231 Encounter for screening mammogram for malignant neoplasm of breast: Secondary | ICD-10-CM | POA: Insufficient documentation

## 2023-02-22 MED ORDER — METOPROLOL TARTRATE 100 MG PO TABS: ORAL_TABLET | ORAL | 0 refills | Status: AC

## 2023-02-22 NOTE — Telephone Encounter (Signed)
Reaching out to patient to offer assistance regarding upcoming cardiac imaging study; pt verbalizes understanding of appt date/time, parking situation and where to check in, pre-test NPO status and medications ordered, and verified current allergies; name and call back number provided for further questions should they arise ? ?Hashem Goynes RN Navigator Cardiac Imaging ?[facility] ?[phone] office ?[phone] cell ? ?Patient to take 100mg metoprolol tartrate two hours prior to her cardiac CT scan.  ?

## 2023-02-24 ENCOUNTER — Ambulatory Visit
Admission: RE | Admit: 2023-02-24 | Discharge: 2023-02-24 | Disposition: A | Discharge status: Home / Self Care | Payer: Managed Care, Other (non HMO) | Source: Ambulatory Visit | Attending: Internal Medicine | Admitting: Internal Medicine

## 2023-02-24 DIAGNOSIS — R0789 Other chest pain: Secondary | ICD-10-CM | POA: Insufficient documentation

## 2023-02-24 LAB — POCT I-STAT CREATININE: Creatinine, Ser: 0.7 mg/dL (ref 0.44–1.00)

## 2023-02-24 MED ORDER — NITROGLYCERIN 0.4 MG SL SUBL
0.8000 mg | SUBLINGUAL_TABLET | Freq: Once | SUBLINGUAL | Status: AC
  Administered 2023-02-24: 0.8 mg via SUBLINGUAL

## 2023-02-24 MED ORDER — IOHEXOL 350 MG/ML SOLN
100.0000 mL | Freq: Once | INTRAVENOUS | Status: AC | PRN
  Administered 2023-02-24: 100 mL via INTRAVENOUS

## 2023-02-24 MED ORDER — NITROGLYCERIN 0.4 MG SL SUBL
SUBLINGUAL_TABLET | SUBLINGUAL | Status: AC
  Filled 2023-02-24: qty 2

## 2023-02-24 NOTE — Progress Notes (Signed)
Patient tolerated procedure well. Ambulate w/o difficulty. Denies any lightheadedness or being dizzy. Pt denies any pain at this time. Sitting in chair, pt is encouraged to drink additional water throughout the day and reason explained to patient. Patient verbalized understanding and all questions answered. ABC intact. No further needs at this time. Discharge from procedure area w/o issues.  

## 2023-06-09 ENCOUNTER — Other Ambulatory Visit: Payer: Self-pay | Admitting: Family Medicine

## 2023-06-09 DIAGNOSIS — F331 Major depressive disorder, recurrent, moderate: Secondary | ICD-10-CM

## 2023-08-16 ENCOUNTER — Other Ambulatory Visit: Payer: Self-pay | Admitting: Family Medicine

## 2023-08-16 DIAGNOSIS — F331 Major depressive disorder, recurrent, moderate: Secondary | ICD-10-CM

## 2023-08-18 NOTE — Telephone Encounter (Signed)
Requested medication (s) are due for refill today: yes  Requested medication (s) are on the active medication list: yes  Last refill:  06/09/23 #90 0 refills  Future visit scheduled: no   Notes to clinic:   overdue encounter x 3 months. Called patient to schedule appt for medication refills. No answer. LVMTCB. Do you want to give courtesy refill?     Requested Prescriptions  Pending Prescriptions Disp Refills   buPROPion (WELLBUTRIN XL) 300 MG 24 hr tablet [Pharmacy Med Name: buPROPion HCl ER (XL) 300 MG Oral Tablet Extended Release 24 Hour] 90 tablet 3    Sig: TAKE 1 TABLET BY MOUTH DAILY     Psychiatry: Antidepressants - bupropion Failed - 08/16/2023 10:05 PM      Failed - AST in normal range and within 360 days    AST  Date Value Ref Range Status  02/17/2021 7 0 - 40 IU/L Final         Failed - ALT in normal range and within 360 days    ALT  Date Value Ref Range Status  02/17/2021 10 0 - 32 IU/L Final         Failed - Valid encounter within last 6 months    Recent Outpatient Visits           9 months ago Leg pain, posterior, right   [facility] Smitty Cords, DO   1 year ago Intermittent palpitations   [place] [facility] Smitty Cords, DO   2 years ago Annual physical exam   [place] [facility] Smitty Cords, DO   2 years ago Bilateral lower extremity edema   [place] [facility] [place], Netta Neat, DO   2 years ago Major depressive disorder, recurrent, moderate [facility])    [facility] [place], Netta Neat, [place]              Passed - Cr in normal range and within 360 days    Creatinine, Ser  Date Value Ref Range Status  02/24/2023 0.70 0.44 - 1.00 mg/dL Final         Passed - Completed PHQ-2 or PHQ-9 in the last 360 days      Passed - Last BP in normal range    BP Readings from Last 1 Encounters:   02/24/23 109/75

## 2023-08-18 NOTE — Telephone Encounter (Signed)
Called patient to schedule appt for medication refills. No answer, LVMTCB 

## 2023-10-24 ENCOUNTER — Other Ambulatory Visit: Payer: Self-pay | Admitting: Family Medicine

## 2023-10-24 DIAGNOSIS — F331 Major depressive disorder, recurrent, moderate: Secondary | ICD-10-CM

## 2023-10-25 NOTE — Telephone Encounter (Signed)
 Requested medications are due for refill today.  yes  Requested medications are on the active medications list.  yes  Last refill. 08/18/2023 #90 0 rf  Future visit scheduled.   no  Notes to clinic.  Labs are expired. Pt is more than 3 months overdue for an ov.    Requested Prescriptions  Pending Prescriptions Disp Refills   buPROPion (WELLBUTRIN XL) 300 MG 24 hr tablet [Pharmacy Med Name: buPROPion HCl ER (XL) 300 MG Oral Tablet Extended Release 24 Hour] 90 tablet 3    Sig: TAKE 1 TABLET BY MOUTH DAILY     Psychiatry: Antidepressants - bupropion Failed - 10/25/2023  5:56 PM      Failed - AST in normal range and within 360 days    AST  Date Value Ref Range Status  02/17/2021 7 0 - 40 IU/L Final         Failed - ALT in normal range and within 360 days    ALT  Date Value Ref Range Status  02/17/2021 10 0 - 32 IU/L Final         Failed - Completed PHQ-2 or PHQ-9 in the last 360 days      Failed - Valid encounter within last 6 months    Recent Outpatient Visits           12 months ago Leg pain, posterior, right   [place] [facility] Smitty Cords, DO   1 year ago Intermittent palpitations   [place] [facility] Smitty Cords, DO   2 years ago Annual physical exam   [place] [facility] Smitty Cords, DO   2 years ago Bilateral lower extremity edema   [place] [facility] [place], Netta Neat, DO   2 years ago Major depressive disorder, recurrent, moderate [facility])   [place] [facility] [place], Netta Neat, [place]              Passed - Cr in normal range and within 360 days    Creatinine, Ser  Date Value Ref Range Status  02/24/2023 0.70 0.44 - 1.00 mg/dL Final         Passed - Last BP in normal range    BP Readings from Last 1 Encounters:  02/24/23 109/75

## 2024-01-14 ENCOUNTER — Other Ambulatory Visit: Payer: Self-pay | Admitting: Family Medicine

## 2024-01-14 DIAGNOSIS — Z1231 Encounter for screening mammogram for malignant neoplasm of breast: Secondary | ICD-10-CM

## 2024-02-23 ENCOUNTER — Ambulatory Visit
Admission: RE | Admit: 2024-02-23 | Discharge: 2024-02-23 | Disposition: A | Discharge status: Home / Self Care | Payer: Self-pay | Source: Ambulatory Visit | Attending: Family Medicine | Admitting: Family Medicine

## 2024-02-23 DIAGNOSIS — Z1231 Encounter for screening mammogram for malignant neoplasm of breast: Secondary | ICD-10-CM | POA: Diagnosis present
